# Patient Record
Sex: Female | Born: 1990 | Race: Black or African American | Hispanic: No | Marital: Single | Smoking: Former smoker
Health system: Southern US, Community
[De-identification: ages and names within clinical notes are randomized; demographics above are authoritative.]

## PROBLEM LIST (undated history)

## (undated) DIAGNOSIS — F32A Depression, unspecified: Secondary | ICD-10-CM

## (undated) DIAGNOSIS — R519 Headache, unspecified: Secondary | ICD-10-CM

## (undated) DIAGNOSIS — F329 Major depressive disorder, single episode, unspecified: Secondary | ICD-10-CM

## (undated) DIAGNOSIS — R0602 Shortness of breath: Secondary | ICD-10-CM

## (undated) DIAGNOSIS — R51 Headache: Secondary | ICD-10-CM

## (undated) DIAGNOSIS — F419 Anxiety disorder, unspecified: Secondary | ICD-10-CM

## (undated) DIAGNOSIS — M549 Dorsalgia, unspecified: Secondary | ICD-10-CM

## (undated) DIAGNOSIS — J45909 Unspecified asthma, uncomplicated: Secondary | ICD-10-CM

## (undated) DIAGNOSIS — R079 Chest pain, unspecified: Secondary | ICD-10-CM

## (undated) DIAGNOSIS — M2550 Pain in unspecified joint: Secondary | ICD-10-CM

## (undated) DIAGNOSIS — D649 Anemia, unspecified: Secondary | ICD-10-CM

## (undated) HISTORY — DX: Anxiety disorder, unspecified: F41.9

## (undated) HISTORY — DX: Major depressive disorder, single episode, unspecified: F32.9

## (undated) HISTORY — DX: Headache, unspecified: R51.9

## (undated) HISTORY — DX: Headache: R51

## (undated) HISTORY — DX: Chest pain, unspecified: R07.9

## (undated) HISTORY — DX: Anemia, unspecified: D64.9

## (undated) HISTORY — PX: WISDOM TOOTH EXTRACTION: SHX21

## (undated) HISTORY — DX: Shortness of breath: R06.02

## (undated) HISTORY — DX: Pain in unspecified joint: M25.50

## (undated) HISTORY — DX: Depression, unspecified: F32.A

## (undated) HISTORY — DX: Dorsalgia, unspecified: M54.9

## (undated) HISTORY — PX: CORNEAL TRANSPLANT: SHX108

## (undated) HISTORY — DX: Unspecified asthma, uncomplicated: J45.909

---

## 2013-05-05 DIAGNOSIS — H18609 Keratoconus, unspecified, unspecified eye: Secondary | ICD-10-CM | POA: Insufficient documentation

## 2014-04-28 ENCOUNTER — Emergency Department (HOSPITAL_COMMUNITY): Payer: Self-pay

## 2014-04-28 ENCOUNTER — Emergency Department (HOSPITAL_COMMUNITY)
Admission: EM | Admit: 2014-04-28 | Discharge: 2014-04-28 | Disposition: A | Discharge status: Home / Self Care | Payer: Self-pay | Attending: Emergency Medicine | Admitting: Emergency Medicine

## 2014-04-28 ENCOUNTER — Encounter (HOSPITAL_COMMUNITY): Payer: Self-pay | Admitting: Physical Medicine and Rehabilitation

## 2014-04-28 DIAGNOSIS — Z7983 Long term (current) use of bisphosphonates: Secondary | ICD-10-CM | POA: Insufficient documentation

## 2014-04-28 DIAGNOSIS — Z3202 Encounter for pregnancy test, result negative: Secondary | ICD-10-CM | POA: Insufficient documentation

## 2014-04-28 DIAGNOSIS — N920 Excessive and frequent menstruation with regular cycle: Secondary | ICD-10-CM | POA: Insufficient documentation

## 2014-04-28 DIAGNOSIS — R102 Pelvic and perineal pain: Secondary | ICD-10-CM | POA: Insufficient documentation

## 2014-04-28 LAB — CBC WITH DIFFERENTIAL/PLATELET
Basophils Absolute: 0 10*3/uL (ref 0.0–0.1)
Basophils Relative: 0 % (ref 0–1)
Eosinophils Absolute: 0.1 10*3/uL (ref 0.0–0.7)
Eosinophils Relative: 1 % (ref 0–5)
HCT: 31.8 % — ABNORMAL LOW (ref 36.0–46.0)
Hemoglobin: 10.2 g/dL — ABNORMAL LOW (ref 12.0–15.0)
Lymphocytes Relative: 38 % (ref 12–46)
Lymphs Abs: 1.8 10*3/uL (ref 0.7–4.0)
MCH: 27.1 pg (ref 26.0–34.0)
MCHC: 32.1 g/dL (ref 30.0–36.0)
MCV: 84.6 fL (ref 78.0–100.0)
Monocytes Absolute: 0.5 10*3/uL (ref 0.1–1.0)
Monocytes Relative: 11 % (ref 3–12)
Neutro Abs: 2.4 10*3/uL (ref 1.7–7.7)
Neutrophils Relative %: 50 % (ref 43–77)
Platelets: 390 10*3/uL (ref 150–400)
RBC: 3.76 MIL/uL — ABNORMAL LOW (ref 3.87–5.11)
RDW: 13.1 % (ref 11.5–15.5)
WBC: 4.9 10*3/uL (ref 4.0–10.5)

## 2014-04-28 LAB — WET PREP, GENITAL
Trich, Wet Prep: NONE SEEN
WBC, Wet Prep HPF POC: NONE SEEN
Yeast Wet Prep HPF POC: NONE SEEN

## 2014-04-28 LAB — URINALYSIS, ROUTINE W REFLEX MICROSCOPIC
Bilirubin Urine: NEGATIVE
Glucose, UA: NEGATIVE mg/dL
Ketones, ur: NEGATIVE mg/dL
Nitrite: NEGATIVE
Protein, ur: 30 mg/dL — AB
Specific Gravity, Urine: 1.017 (ref 1.005–1.030)
Urobilinogen, UA: 0.2 mg/dL (ref 0.0–1.0)
pH: 5.5 (ref 5.0–8.0)

## 2014-04-28 LAB — URINE MICROSCOPIC-ADD ON

## 2014-04-28 LAB — COMPREHENSIVE METABOLIC PANEL
ALT: 16 U/L (ref 0–35)
AST: 18 U/L (ref 0–37)
Albumin: 3.5 g/dL (ref 3.5–5.2)
Alkaline Phosphatase: 83 U/L (ref 39–117)
Anion gap: 10 (ref 5–15)
BUN: 9 mg/dL (ref 6–23)
CO2: 25 mEq/L (ref 19–32)
Calcium: 8.8 mg/dL (ref 8.4–10.5)
Chloride: 105 mEq/L (ref 96–112)
Creatinine, Ser: 0.58 mg/dL (ref 0.50–1.10)
GFR calc Af Amer: >90 mL/min (ref >90)
GFR calc non Af Amer: >90 mL/min (ref >90)
Glucose, Bld: 91 mg/dL (ref 70–99)
Potassium: 3.9 mEq/L (ref 3.7–5.3)
Sodium: 140 mEq/L (ref 137–147)
Total Bilirubin: 0.3 mg/dL (ref 0.3–1.2)
Total Protein: 7.1 g/dL (ref 6.0–8.3)

## 2014-04-28 LAB — POC URINE PREG, ED: Preg Test, Ur: NEGATIVE

## 2014-04-28 LAB — RPR

## 2014-04-28 LAB — HIV ANTIBODY (ROUTINE TESTING W REFLEX): HIV 1&2 Ab, 4th Generation: NONREACTIVE

## 2014-04-28 MED ORDER — HYDROCODONE-ACETAMINOPHEN 5-325 MG PO TABS: 1.0000 | ORAL_TABLET | ORAL | Status: DC | PRN

## 2014-04-28 MED ORDER — MEDROXYPROGESTERONE ACETATE 5 MG PO TABS: 10.0000 mg | ORAL_TABLET | Freq: Every day | ORAL | Status: DC

## 2014-04-28 MED ORDER — HYDROCODONE-ACETAMINOPHEN 5-325 MG PO TABS
1.0000 | ORAL_TABLET | Freq: Once | ORAL | Status: AC
  Administered 2014-04-28: 1 via ORAL
  Filled 2014-04-28: qty 1

## 2014-04-28 NOTE — ED Notes (Signed)
Pt just returned from [place]

## 2014-04-28 NOTE — ED Provider Notes (Signed)
CSN: [id number]     Arrival date & time 04/28/14  1301 History   First MD Initiated Contact with Patient 04/28/14 1531     Chief Complaint  Patient presents with  . Abdominal Pain     (Consider location/radiation/quality/duration/timing/severity/associated sxs/prior Treatment) HPI  Traci Finley is a 23 y.o. female without significant PMH presenting with 2 days of severe bilateral menstrual cramps as well as passing blood clots from her vagina at the onset of menses. Patient states her normal menstrual periods at times can have blood clots but these ones are larger times and on and other times a golfball. Patient states she saw her OB/GYN yesterday and had blood work drawn and was found to be mildly anemic. No other medications were prescribed at that time. Patient denies fevers, chills, illnesses, nausea, vomiting, constipation. Last BM today and normal without blood. Patient states her menses are every month and regular. She takes no oral contraceptive pills or IUD. She states she is not sexually active. No change in vaginal discharge or foul odor. No urinary symptoms.   History reviewed. No pertinent past medical history. History reviewed. No pertinent past surgical history. No family history on file. History  Substance Use Topics  . Smoking status: Never Smoker   . Smokeless tobacco: Not on file  . Alcohol Use: No   OB History    No data available     Review of Systems  Constitutional: Negative for fever and chills.  HENT: Negative for congestion and rhinorrhea.   Respiratory: Negative for shortness of breath.   Gastrointestinal: Negative for nausea, vomiting and diarrhea.  Genitourinary: Positive for vaginal bleeding and pelvic pain. Negative for dysuria and hematuria.  Musculoskeletal: Negative for back pain and gait problem.  Skin: Negative for rash.  Neurological: Negative for weakness and headaches.      Allergies  Review of patient's allergies indicates no known  allergies.  Home Medications   Prior to Admission medications   Medication Sig Start Date End Date Taking? Authorizing Provider  HYDROcodone-acetaminophen (NORCO/VICODIN) 5-325 MG per tablet Take 1 tablet by mouth every 4 (four) hours as needed for moderate pain or severe pain. 04/28/14   Louann Sjogren, PA-C  ibuprofen (ADVIL,MOTRIN) 200 MG tablet Take 200 mg by mouth every 6 (six) hours as needed for mild pain.   Yes Historical Provider, MD  medroxyPROGESTERone (PROVERA) 5 MG tablet Take 2 tablets (10 mg total) by mouth daily. 04/28/14   Benetta Spar L Aronda Burford, PA-C   BP 125/73 mmHg  Pulse 62  Temp(Src) 98.2 F (36.8 C) (Oral)  Resp 15  Ht 5\' 4"  (1.626 m)  Wt 215 lb (97.523 kg)  BMI 36.89 kg/m2  SpO2 100% Physical Exam  Constitutional: She appears well-developed and well-nourished. No distress.  HENT:  Head: Normocephalic and atraumatic.  Eyes: Conjunctivae and EOM are normal. Right eye exhibits no discharge. Left eye exhibits no discharge.  Cardiovascular: Normal rate, regular rhythm and normal heart sounds.   Pulmonary/Chest: Effort normal and breath sounds normal. No respiratory distress. She has no wheezes.  Abdominal: Soft. Bowel sounds are normal. She exhibits no distension. There is no tenderness.  Mild right, mid, left suprapubic tenderness without rebound, guarding, rigidity. No CVA tenderness or back tenderness.  Genitourinary:  Cervix pink without lesions. Os closed. No CMT mild left and right adnexal tenderness. Minimal discharge without foul odor. Blood from os, that does not rapidly reaccumulate. Nursing tech in room for exam.   Neurological: She is alert. She exhibits  normal muscle tone. Coordination normal.  Skin: Skin is warm and dry. She is not diaphoretic.  Nursing note and vitals reviewed.   ED Course  Procedures (including critical care time) Labs Review Labs Reviewed  WET PREP, GENITAL - Abnormal; Notable for the following:    Clue Cells Wet Prep HPF POC  FEW (*)    All other components within normal limits  CBC WITH DIFFERENTIAL - Abnormal; Notable for the following:    RBC 3.76 (*)    Hemoglobin 10.2 (*)    HCT 31.8 (*)    All other components within normal limits  URINALYSIS, ROUTINE W REFLEX MICROSCOPIC - Abnormal; Notable for the following:    Color, Urine RED (*)    APPearance HAZY (*)    Hgb urine dipstick LARGE (*)    Protein, ur 30 (*)    Leukocytes, UA SMALL (*)    All other components within normal limits  GC/CHLAMYDIA PROBE AMP  COMPREHENSIVE METABOLIC PANEL  RPR  URINE MICROSCOPIC-ADD ON  HIV ANTIBODY (ROUTINE TESTING)  POC URINE PREG, ED    Imaging Review [place] Pelvis Complete  04/28/2014   CLINICAL DATA:  Pelvic pain in female.  EXAM: TRANSABDOMINAL ULTRASOUND OF PELVIS  DOPPLER ULTRASOUND OF OVARIES  TECHNIQUE: Transabdominal ultrasound examination of the pelvis was performed including evaluation of the uterus, ovaries, adnexal regions, and pelvic cul-de-sac.  Color and duplex Doppler ultrasound was utilized to evaluate blood flow to the ovaries.  COMPARISON:  None.  FINDINGS: Uterus  Measurements: 6.3 x 5.5 x 4.1 cm. No fibroids or other mass visualized.  Endometrium  Thickness: 6.1 mm. No focal abnormality visualized.  Right ovary  Measurements: 2.1 x 1.7 x 1.6 cm. Normal appearance/no adnexal mass.  Left ovary  Measurements: 2.6 x 2.6 x 1.4 cm. Normal appearance/no adnexal mass.  Pulsed Doppler evaluation demonstrates normal low-resistance arterial and venous waveforms in both ovaries.  IMPRESSION: No abnormality seen in the pelvis.   Electronically Signed   By: Roque LiasJames  Green M.D.   On: 04/28/2014 19:43   [place] Art/ven Flow Abd Pelv Doppler  04/28/2014   CLINICAL DATA:  Pelvic pain in female.  EXAM: TRANSABDOMINAL ULTRASOUND OF PELVIS  DOPPLER ULTRASOUND OF OVARIES  TECHNIQUE: Transabdominal ultrasound examination of the pelvis was performed including evaluation of the uterus, ovaries, adnexal regions, and pelvic cul-de-sac.   Color and duplex Doppler ultrasound was utilized to evaluate blood flow to the ovaries.  COMPARISON:  None.  FINDINGS: Uterus  Measurements: 6.3 x 5.5 x 4.1 cm. No fibroids or other mass visualized.  Endometrium  Thickness: 6.1 mm. No focal abnormality visualized.  Right ovary  Measurements: 2.1 x 1.7 x 1.6 cm. Normal appearance/no adnexal mass.  Left ovary  Measurements: 2.6 x 2.6 x 1.4 cm. Normal appearance/no adnexal mass.  Pulsed Doppler evaluation demonstrates normal low-resistance arterial and venous waveforms in both ovaries.  IMPRESSION: No abnormality seen in the pelvis.   Electronically Signed   By: Roque LiasJames  Green M.D.   On: 04/28/2014 19:43     EKG Interpretation None      MDM   Final diagnoses:  Pelvic pain in female  Menorrhagia with regular cycle   Patient with 2 days of severe menstrual cramping as well as passing large blood clots. VSS. Patient with right mid and left suprapubic abdominal tenderness without rebound or signs of peritonitis. Patient without chest pain, lightheadedness or dizziness. Labs reassuring. No signs of infection in urine. Pelvic exam with normal discomfort. No white blood cells seen.  Blood did not reaccumulate rapidly. GC and Chlamydia as well as syphilis and HIV testing pending. I doubt PID. Patient pelvic ultrasound without signs of poor vision, adnexal masses or fibroids. Source of patient's abdominal pain unclear and likely related to her heavy bleeding and menses. Patient given short course of pain medicines. Driving and sedation precautions provided. Patient also given Provera for her heavy bleeding as well as instructions to follow-up with her OB/GYN in 2-3 days. Patient is afebrile, nontoxic, and in no acute distress. Patient is appropriate for outpatient management and is stable for discharge.  Discussed return precautions with patient. Discussed all results and patient verbalizes understanding and agrees with plan.  Case has been discussed with Dr.  Blinda LeatherwoodPollina who agrees with the above plan and to discharge.      Louann SjogrenVictoria L Jeffory Snelgrove, PA-C 04/28/14 2059  Gilda Creasehristopher J. Pollina, MD 04/29/14 1536

## 2014-04-28 NOTE — ED Notes (Signed)
Patient transported to Ultrasound 

## 2014-04-28 NOTE — Discharge Instructions (Signed)
Return to the emergency room with worsening of symptoms, new symptoms or with symptoms that are concerning, especially fevers, chest pain, lightheadedness, nausea, unable to tolerate fluids, severe abdominal pain, vomiting blood or blood in stool. Ibuprofen 400mg  (2 tablets 200mg ) every 5-6 hours for 3-5 days. Provera 10mg  daily for 7 days. Norco for severe pain. Do not operate machinery, drive or drink alcohol while taking narcotics or muscle relaxers. Please call your OBGYN doctor for a followup appointment within 24-48 hours. When you talk to your doctor please let them know that you were seen in the emergency department and have them acquire all of your records so that they can discuss the findings with you and formulate a treatment plan to fully care for your new and ongoing problems.    Abdominal Pain, Women Abdominal (stomach, pelvic, or belly) pain can be caused by many things. It is important to tell your doctor:  The location of the pain.  Does it come and go or is it present all the time?  Are there things that start the pain (eating certain foods, exercise)?  Are there other symptoms associated with the pain (fever, nausea, vomiting, diarrhea)? All of this is helpful to know when trying to find the cause of the pain. CAUSES   Stomach: virus or bacteria infection, or ulcer.  Intestine: appendicitis (inflamed appendix), regional ileitis (Crohn's disease), ulcerative colitis (inflamed colon), irritable bowel syndrome, diverticulitis (inflamed diverticulum of the colon), or cancer of the stomach or intestine.  Gallbladder disease or stones in the gallbladder.  Kidney disease, kidney stones, or infection.  Pancreas infection or cancer.  Fibromyalgia (pain disorder).  Diseases of the female organs:  Uterus: fibroid (non-cancerous) tumors or infection.  Fallopian tubes: infection or tubal pregnancy.  Ovary: cysts or tumors.  Pelvic adhesions (scar  tissue).  Endometriosis (uterus lining tissue growing in the pelvis and on the pelvic organs).  Pelvic congestion syndrome (female organs filling up with blood just before the menstrual period).  Pain with the menstrual period.  Pain with ovulation (producing an egg).  Pain with an IUD (intrauterine device, birth control) in the uterus.  Cancer of the female organs.  Functional pain (pain not caused by a disease, may improve without treatment).  Psychological pain.  Depression. DIAGNOSIS  Your doctor will decide the seriousness of your pain by doing an examination.  Blood tests.  X-rays.  Ultrasound.  CT scan (computed tomography, special type of X-ray).  MRI (magnetic resonance imaging).  Cultures, for infection.  Barium enema (dye inserted in the large intestine, to better view it with X-rays).  Colonoscopy (looking in intestine with a lighted tube).  Laparoscopy (minor surgery, looking in abdomen with a lighted tube).  Major abdominal exploratory surgery (looking in abdomen with a large incision). TREATMENT  The treatment will depend on the cause of the pain.   Many cases can be observed and treated at home.  Over-the-counter medicines recommended by your caregiver.  Prescription medicine.  Antibiotics, for infection.  Birth control pills, for painful periods or for ovulation pain.  Hormone treatment, for endometriosis.  Nerve blocking injections.  Physical therapy.  Antidepressants.  Counseling with a psychologist or psychiatrist.  Minor or major surgery. HOME CARE INSTRUCTIONS   Do not take laxatives, unless directed by your caregiver.  Take over-the-counter pain medicine only if ordered by your caregiver. Do not take aspirin because it can cause an upset stomach or bleeding.  Try a clear liquid diet (broth or water) as ordered by  your caregiver. Slowly move to a bland diet, as tolerated, if the pain is related to the stomach or  intestine.  Have a thermometer and take your temperature several times a day, and record it.  Bed rest and sleep, if it helps the pain.  Avoid sexual intercourse, if it causes pain.  Avoid stressful situations.  Keep your follow-up appointments and tests, as your caregiver orders.  If the pain does not go away with medicine or surgery, you may try:  Acupuncture.  Relaxation exercises (yoga, meditation).  Group therapy.  Counseling. SEEK MEDICAL CARE IF:   You notice certain foods cause stomach pain.  Your home care treatment is not helping your pain.  You need stronger pain medicine.  You want your IUD removed.  You feel faint or lightheaded.  You develop nausea and vomiting.  You develop a rash.  You are having side effects or an allergy to your medicine. SEEK IMMEDIATE MEDICAL CARE IF:   Your pain does not go away or gets worse.  You have a fever.  Your pain is felt only in portions of the abdomen. The right side could possibly be appendicitis. The left lower portion of the abdomen could be colitis or diverticulitis.  You are passing blood in your stools (bright red or black tarry stools, with or without vomiting).  You have blood in your urine.  You develop chills, with or without a fever.  You pass out. MAKE SURE YOU:   Understand these instructions.  Will watch your condition.  Will get help right away if you are not doing well or get worse. Document Released: 02/26/2007 Document Revised: 09/15/2013 Document Reviewed: 03/18/2009 [facility] Patient Information 2015 [place], [place]. This information is not intended to replace advice given to you by your health care provider. Make sure you discuss any questions you have with your health care provider.   Abnormal Uterine Bleeding Abnormal uterine bleeding means bleeding from the vagina that is not your normal menstrual period. This can be:  Bleeding or spotting between periods.  Bleeding after sex  (sexual intercourse).  Bleeding that is heavier or more than normal.  Periods that last longer than usual.  Bleeding after menopause. There are many problems that may cause this. Treatment will depend on the cause of the bleeding. Any kind of bleeding that is not normal should be reviewed by your doctor.  HOME CARE Watch your condition for any changes. These actions may lessen any discomfort you are having:  Do not use tampons or douches as told by your doctor.  Change your pads often. You should get regular pelvic exams and Pap tests. Keep all appointments for tests as told by your doctor. GET HELP IF:  You are bleeding for more than 1 week.  You feel dizzy at times. GET HELP RIGHT AWAY IF:   You pass out.  You have to change pads every 15 to 30 minutes.  You have belly pain.  You have a fever.  You become sweaty or weak.  You are passing large blood clots from the vagina.  You feel sick to your stomach (nauseous) and throw up (vomit). MAKE SURE YOU:  Understand these instructions.  Will watch your condition.  Will get help right away if you are not doing well or get worse. Document Released: 02/26/2009 Document Revised: 05/06/2013 Document Reviewed: 11/28/2012 [facility]Exit[facility] Patient Information 2015 [place], [place]. This information is not intended to replace advice given to you by your health care provider. Make sure you  discuss any questions you have with your health care provider. ° °

## 2014-04-28 NOTE — ED Notes (Signed)
Pt states severe menstrual cramps today. Onset 3am, states she passed several large blood clots. Pt states 8/10 pain upon arrival, [facility]. No signs of distress noted at present.

## 2014-04-29 LAB — GC/CHLAMYDIA PROBE AMP
CT Probe RNA: NEGATIVE
GC Probe RNA: NEGATIVE

## 2014-06-10 ENCOUNTER — Ambulatory Visit: Payer: Self-pay | Admitting: Internal Medicine

## 2014-06-13 ENCOUNTER — Encounter (HOSPITAL_COMMUNITY): Payer: Self-pay | Admitting: Emergency Medicine

## 2014-06-13 ENCOUNTER — Emergency Department (HOSPITAL_COMMUNITY): Payer: No Typology Code available for payment source

## 2014-06-13 ENCOUNTER — Emergency Department (HOSPITAL_COMMUNITY)
Admission: EM | Admit: 2014-06-13 | Discharge: 2014-06-14 | Disposition: A | Discharge status: Home / Self Care | Payer: No Typology Code available for payment source | Attending: Emergency Medicine | Admitting: Emergency Medicine

## 2014-06-13 DIAGNOSIS — Y998 Other external cause status: Secondary | ICD-10-CM | POA: Insufficient documentation

## 2014-06-13 DIAGNOSIS — Y92410 Unspecified street and highway as the place of occurrence of the external cause: Secondary | ICD-10-CM | POA: Insufficient documentation

## 2014-06-13 DIAGNOSIS — S8001XA Contusion of right knee, initial encounter: Secondary | ICD-10-CM

## 2014-06-13 DIAGNOSIS — S29012A Strain of muscle and tendon of back wall of thorax, initial encounter: Secondary | ICD-10-CM | POA: Insufficient documentation

## 2014-06-13 DIAGNOSIS — Y9389 Activity, other specified: Secondary | ICD-10-CM | POA: Insufficient documentation

## 2014-06-13 DIAGNOSIS — S24109A Unspecified injury at unspecified level of thoracic spinal cord, initial encounter: Secondary | ICD-10-CM | POA: Diagnosis present

## 2014-06-13 DIAGNOSIS — Z793 Long term (current) use of hormonal contraceptives: Secondary | ICD-10-CM | POA: Insufficient documentation

## 2014-06-13 DIAGNOSIS — T148XXA Other injury of unspecified body region, initial encounter: Secondary | ICD-10-CM
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
## 2014-06-14 MED ORDER — CYCLOBENZAPRINE HCL 10 MG PO TABS: 10.0000 mg | ORAL_TABLET | Freq: Two times a day (BID) | ORAL | Status: DC | PRN

## 2014-06-14 MED ORDER — IBUPROFEN 800 MG PO TABS: 800.0000 mg | ORAL_TABLET | Freq: Three times a day (TID) | ORAL | Status: DC

## 2014-06-14 NOTE — ED Notes (Signed)
Awake. Verbally responsive. A/O x4. Resp even and unlabored. No audible adventitious breath sounds noted. ABC's intact.  
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
## 2014-06-14 NOTE — ED Notes (Signed)
Awake. Verbally responsive. A/O x4. Resp even and unlabored. No audible adventitious breath sounds noted. ABC's intact. NAD noted. Family at bedside.

## 2014-10-27 ENCOUNTER — Encounter: Payer: Self-pay | Admitting: Internal Medicine

## 2014-10-27 ENCOUNTER — Ambulatory Visit (INDEPENDENT_AMBULATORY_CARE_PROVIDER_SITE_OTHER): Payer: BLUE CROSS/BLUE SHIELD | Admitting: Internal Medicine

## 2014-10-27 ENCOUNTER — Other Ambulatory Visit (INDEPENDENT_AMBULATORY_CARE_PROVIDER_SITE_OTHER): Payer: BLUE CROSS/BLUE SHIELD

## 2014-10-27 VITALS — BP 110/74 | HR 68 | Temp 98.7°F | Resp 12 | Ht 64.0 in | Wt 215.0 lb

## 2014-10-27 DIAGNOSIS — E669 Obesity, unspecified: Secondary | ICD-10-CM

## 2014-10-27 DIAGNOSIS — Z0000 Encounter for general adult medical examination without abnormal findings: Secondary | ICD-10-CM

## 2014-10-27 LAB — COMPREHENSIVE METABOLIC PANEL
ALT: 12 U/L (ref 0–35)
AST: 13 U/L (ref 0–37)
Albumin: 3.9 g/dL (ref 3.5–5.2)
Alkaline Phosphatase: 75 U/L (ref 39–117)
BUN: 13 mg/dL (ref 6–23)
CO2: 28 mEq/L (ref 19–32)
Calcium: 8.9 mg/dL (ref 8.4–10.5)
Chloride: 103 mEq/L (ref 96–112)
Creatinine, Ser: 0.69 mg/dL (ref 0.40–1.20)
GFR: 132.63 mL/min (ref >60.00)
Glucose, Bld: 86 mg/dL (ref 70–99)
Potassium: 3.9 mEq/L (ref 3.5–5.1)
Sodium: 136 mEq/L (ref 135–145)
Total Bilirubin: 0.3 mg/dL (ref 0.2–1.2)
Total Protein: 7.3 g/dL (ref 6.0–8.3)

## 2014-10-27 LAB — LIPID PANEL
Cholesterol: 124 mg/dL (ref 0–200)
HDL: 31.8 mg/dL — ABNORMAL LOW (ref >39.00)
LDL Cholesterol: 76 mg/dL (ref 0–99)
NonHDL: 92.2
Total CHOL/HDL Ratio: 4
Triglycerides: 80 mg/dL (ref 0.0–149.0)
VLDL: 16 mg/dL (ref 0.0–40.0)

## 2014-10-27 LAB — CBC
HCT: 32.9 % — ABNORMAL LOW (ref 36.0–46.0)
Hemoglobin: 10.7 g/dL — ABNORMAL LOW (ref 12.0–15.0)
MCHC: 32.6 g/dL (ref 30.0–36.0)
MCV: 85.4 fl (ref 78.0–100.0)
Platelets: 474 10*3/uL — ABNORMAL HIGH (ref 150.0–400.0)
RBC: 3.85 Mil/uL — ABNORMAL LOW (ref 3.87–5.11)
RDW: 14.9 % (ref 11.5–15.5)
WBC: 5.6 10*3/uL (ref 4.0–10.5)

## 2014-10-27 LAB — HEMOGLOBIN A1C: Hgb A1c MFr Bld: 5.1 % (ref 4.6–6.5)

## 2014-10-27 MED ORDER — CYCLOBENZAPRINE HCL 10 MG PO TABS: 5.0000 mg | ORAL_TABLET | Freq: Three times a day (TID) | ORAL | Status: DC | PRN

## 2014-10-27 NOTE — Progress Notes (Signed)
   Subjective:    Patient ID: Traci Finley, female    DOB: [birth year], 24 y.o.   MRN: [id number]  HPI The patient is a 24 YO new female coming in to talk about her weight. She does struggle with her weight for most of her life. She wants to be healthy and not have a shortened life from weight. There is strong history of diabetes in the family. She is going to go back to the gym and plans on starting cross fit in the next few months. She has questions about nutrition and would like to talk to a nutrition specialist. Right now she is working on eating better and decreasing the pre-prepared foods in her diet.   PMH, [facility], social history reviewed and updated.  Review of Systems  Constitutional: Negative for fever, activity change, appetite change and fatigue.  HENT: Negative.   Eyes: Negative.   Respiratory: Negative for cough, chest tightness, shortness of breath and wheezing.   Cardiovascular: Negative for chest pain, palpitations and leg swelling.  Gastrointestinal: Negative for abdominal pain, diarrhea, constipation and abdominal distention.  Musculoskeletal: Negative.   Skin: Negative.   Neurological: Negative for dizziness, weakness, light-headedness and numbness.  Psychiatric/Behavioral: Negative.       Objective:   Physical Exam  Constitutional: She is oriented to person, place, and time. She appears well-developed and well-nourished.  Overweight  HENT:  Head: Normocephalic and atraumatic.  Eyes: EOM are normal.  Neck: Normal range of motion.  Cardiovascular: Normal rate and regular rhythm.   Pulmonary/Chest: Effort normal and breath sounds normal. No respiratory distress. She has no wheezes. She has no rales.  Abdominal: Soft. Bowel sounds are normal. She exhibits no distension. There is no tenderness. There is no rebound.  Musculoskeletal: She exhibits no edema.  Neurological: She is alert and oriented to person, place, and time. Coordination normal.  Skin: Skin is warm and  dry.  Psychiatric: She has a normal mood and affect.   Filed Vitals:   10/27/14 1620  BP: 110/74  Pulse: 68  Temp: 98.7 F (37.1 C)  TempSrc: Oral  Resp: 12  Height: 5\' 4"  (1.626 m)  Weight: 215 lb (97.523 kg)      Assessment & Plan:

## 2014-10-27 NOTE — Progress Notes (Signed)
Pre visit review using our clinic review tool, if applicable. No additional management support is needed unless otherwise documented below in the visit note. 

## 2014-10-27 NOTE — Patient Instructions (Signed)
We will check on the blood work today and call you back with the results.   We will also send you to the nutritionist to jump start the healthy eating.   Come back in about 6-12 months so we can check on you. If you have problems or questions please feel free to call the office.   Health Maintenance Adopting a healthy lifestyle and getting preventive care can go a long way to promote health and wellness. Talk with your health care provider about what schedule of regular examinations is right for you. This is a good chance for you to check in with your provider about disease prevention and staying healthy. In between checkups, there are plenty of things you can do on your own. Experts have done a lot of research about which lifestyle changes and preventive measures are most likely to keep you healthy. Ask your health care provider for more information. WEIGHT AND DIET  Eat a healthy diet  Be sure to include plenty of vegetables, fruits, low-fat dairy products, and lean protein.  Do not eat a lot of foods high in solid fats, added sugars, or salt.  Get regular exercise. This is one of the most important things you can do for your health.  Most adults should exercise for at least 150 minutes each week. The exercise should increase your heart rate and make you sweat (moderate-intensity exercise).  Most adults should also do strengthening exercises at least twice a week. This is in addition to the moderate-intensity exercise.  Maintain a healthy weight  Body mass index (BMI) is a measurement that can be used to identify possible weight problems. It estimates body fat based on height and weight. Your health care provider can help determine your BMI and help you achieve or maintain a healthy weight.  For females 20 years of age and older:   A BMI below 18.5 is considered underweight.  A BMI of 18.5 to 24.9 is normal.  A BMI of 25 to 29.9 is considered overweight.  A BMI of 30 and above is  considered obese.  Watch levels of cholesterol and blood lipids  You should start having your blood tested for lipids and cholesterol at 26 years of age, then have this test every 5 years.  You may need to have your cholesterol levels checked more often if:  Your lipid or cholesterol levels are high.  You are older than 26 years of age.  You are at high risk for heart disease.  CANCER SCREENING   Lung Cancer  Lung cancer screening is recommended for adults 55-80 years old who are at high risk for lung cancer because of a history of smoking.  A yearly low-dose CT scan of the lungs is recommended for people who:  Currently smoke.  Have quit within the past 15 years.  Have at least a 30-pack-year history of smoking. A pack year is smoking an average of one pack of cigarettes a day for 1 year.  Yearly screening should continue until it has been 15 years since you quit.  Yearly screening should stop if you develop a health problem that would prevent you from having lung cancer treatment.  Breast Cancer  Practice breast self-awareness. This means understanding how your breasts normally appear and feel.  It also means doing regular breast self-exams. Let your health care provider know about any changes, no matter how small.  If you are in your 20s or 30s, you should have a clinical breast exam (  CBE) by a health care provider every 1-3 years as part of a regular health exam.  If you are 40 or older, have a CBE every year. Also consider having a breast X-ray (mammogram) every year.  If you have a family history of breast cancer, talk to your health care provider about genetic screening.  If you are at high risk for breast cancer, talk to your health care provider about having an MRI and a mammogram every year.  Breast cancer gene (BRCA) assessment is recommended for women who have family members with BRCA-related cancers. BRCA-related cancers  include:  Breast.  Ovarian.  Tubal.  Peritoneal cancers.  Results of the assessment will determine the need for genetic counseling and BRCA1 and BRCA2 testing. Cervical Cancer Routine pelvic examinations to screen for cervical cancer are no longer recommended for nonpregnant women who are considered low risk for cancer of the pelvic organs (ovaries, uterus, and vagina) and who do not have symptoms. A pelvic examination may be necessary if you have symptoms including those associated with pelvic infections. Ask your health care provider if a screening pelvic exam is right for you.   The Pap test is the screening test for cervical cancer for women who are considered at risk.  If you had a hysterectomy for a problem that was not cancer or a condition that could lead to cancer, then you no longer need Pap tests.  If you are older than 65 years, and you have had normal Pap tests for the past 10 years, you no longer need to have Pap tests.  If you have had past treatment for cervical cancer or a condition that could lead to cancer, you need Pap tests and screening for cancer for at least 20 years after your treatment.  If you no longer get a Pap test, assess your risk factors if they change (such as having a new sexual partner). This can affect whether you should start being screened again.  Some women have medical problems that increase their chance of getting cervical cancer. If this is the case for you, your health care provider may recommend more frequent screening and Pap tests.  The human papillomavirus (HPV) test is another test that may be used for cervical cancer screening. The HPV test looks for the virus that can cause cell changes in the cervix. The cells collected during the Pap test can be tested for HPV.  The HPV test can be used to screen women 30 years of age and older. Getting tested for HPV can extend the interval between normal Pap tests from three to five years.  An HPV  test also should be used to screen women of any age who have unclear Pap test results.  After 26 years of age, women should have HPV testing as often as Pap tests.  Colorectal Cancer  This type of cancer can be detected and often prevented.  Routine colorectal cancer screening usually begins at 26 years of age and continues through 26 years of age.  Your health care provider may recommend screening at an earlier age if you have risk factors for colon cancer.  Your health care provider may also recommend using home test kits to check for hidden blood in the stool.  A small camera at the end of a tube can be used to examine your colon directly (sigmoidoscopy or colonoscopy). This is done to check for the earliest forms of colorectal cancer.  Routine screening usually begins at age 50.    Direct examination of the colon should be repeated every 5-10 years through 26 years of age. However, you may need to be screened more often if early forms of precancerous polyps or small growths are found. Skin Cancer  Check your skin from head to toe regularly.  Tell your health care provider about any new moles or changes in moles, especially if there is a change in a mole's shape or color.  Also tell your health care provider if you have a mole that is larger than the size of a pencil eraser.  Always use sunscreen. Apply sunscreen liberally and repeatedly throughout the day.  Protect yourself by wearing long sleeves, pants, a wide-brimmed hat, and sunglasses whenever you are outside. HEART DISEASE, DIABETES, AND HIGH BLOOD PRESSURE   Have your blood pressure checked at least every 1-2 years. High blood pressure causes heart disease and increases the risk of stroke.  If you are between 83 years and 38 years old, ask your health care provider if you should take aspirin to prevent strokes.  Have regular diabetes screenings. This involves taking a blood sample to check your fasting blood sugar  level.  If you are at a normal weight and have a low risk for diabetes, have this test once every three years after 26 years of age.  If you are overweight and have a high risk for diabetes, consider being tested at a younger age or more often. PREVENTING INFECTION  Hepatitis B  If you have a higher risk for hepatitis B, you should be screened for this virus. You are considered at high risk for hepatitis B if:  You were born in a country where hepatitis B is common. Ask your health care provider which countries are considered high risk.  Your parents were born in a high-risk country, and you have not been immunized against hepatitis B (hepatitis B vaccine).  You have HIV or AIDS.  You use needles to inject street drugs.  You live with someone who has hepatitis B.  You have had sex with someone who has hepatitis B.  You get hemodialysis treatment.  You take certain medicines for conditions, including cancer, organ transplantation, and autoimmune conditions. Hepatitis C  Blood testing is recommended for:  Everyone born from [birth year] through 1967.  Anyone with known risk factors for hepatitis C. Sexually transmitted infections (STIs)  You should be screened for sexually transmitted infections (STIs) including gonorrhea and chlamydia if:  You are sexually active and are younger than 26 years of age.  You are older than 27 years of age and your health care provider tells you that you are at risk for this type of infection.  Your sexual activity has changed since you were last screened and you are at an increased risk for chlamydia or gonorrhea. Ask your health care provider if you are at risk.  If you do not have HIV, but are at risk, it may be recommended that you take a prescription medicine daily to prevent HIV infection. This is called pre-exposure prophylaxis (PrEP). You are considered at risk if:  You are sexually active and do not regularly use condoms or know the HIV status  of your partner(s).  You take drugs by injection.  You are sexually active with a partner who has HIV. Talk with your health care provider about whether you are at high risk of being infected with HIV. If you choose to begin PrEP, you should first be tested for HIV. You should then be  tested every 3 months for as long as you are taking PrEP.  PREGNANCY   If you are premenopausal and you may become pregnant, ask your health care provider about preconception counseling.  If you may become pregnant, take 400 to 800 micrograms (mcg) of folic acid every day.  If you want to prevent pregnancy, talk to your health care provider about birth control (contraception). OSTEOPOROSIS AND MENOPAUSE   Osteoporosis is a disease in which the bones lose minerals and strength with aging. This can result in serious bone fractures. Your risk for osteoporosis can be identified using a bone density scan.  If you are 75 years of age or older, or if you are at risk for osteoporosis and fractures, ask your health care provider if you should be screened.  Ask your health care provider whether you should take a calcium or vitamin D supplement to lower your risk for osteoporosis.  Menopause may have certain physical symptoms and risks.  Hormone replacement therapy may reduce some of these symptoms and risks. Talk to your health care provider about whether hormone replacement therapy is right for you.  HOME CARE INSTRUCTIONS   Schedule regular health, dental, and eye exams.  Stay current with your immunizations.   Do not use any tobacco products including cigarettes, chewing tobacco, or electronic cigarettes.  If you are pregnant, do not drink alcohol.  If you are breastfeeding, limit how much and how often you drink alcohol.  Limit alcohol intake to no more than 1 drink per day for nonpregnant women. One drink equals 12 ounces of beer, 5 ounces of wine, or 1 ounces of hard liquor.  Do not use street  drugs.  Do not share needles.  Ask your health care provider for help if you need support or information about quitting drugs.  Tell your health care provider if you often feel depressed.  Tell your health care provider if you have ever been abused or do not feel safe at home. Document Released: 11/14/2010 Document Revised: 09/15/2013 Document Reviewed: 04/02/2013 [facility] Patient Information 2015 [place], [place]. This information is not intended to replace advice given to you by your health care provider. Make sure you discuss any questions you have with your health care provider.

## 2014-10-29 DIAGNOSIS — E669 Obesity, unspecified: Secondary | ICD-10-CM | POA: Insufficient documentation

## 2014-10-29 NOTE — Assessment & Plan Note (Signed)
BP okay, checking labs for any complications. Referral to medical nutrition and she is going to start an exercise program. She is highly motivated for success. Talked with her about various gym supplements and recommended that eating healthy diet and appropriate calories for exercise regimen is generally sufficient and would not recommend protein supplements, creatine, herbal supplements.

## 2014-12-11 ENCOUNTER — Ambulatory Visit: Payer: BLUE CROSS/BLUE SHIELD | Admitting: Skilled Nursing Facility1

## 2015-10-12 DIAGNOSIS — M25572 Pain in left ankle and joints of left foot: Secondary | ICD-10-CM | POA: Diagnosis not present

## 2015-11-04 ENCOUNTER — Encounter: Payer: Self-pay | Admitting: Internal Medicine

## 2015-11-04 ENCOUNTER — Ambulatory Visit (INDEPENDENT_AMBULATORY_CARE_PROVIDER_SITE_OTHER): Payer: BLUE CROSS/BLUE SHIELD | Admitting: Internal Medicine

## 2015-11-04 ENCOUNTER — Other Ambulatory Visit (INDEPENDENT_AMBULATORY_CARE_PROVIDER_SITE_OTHER): Payer: BLUE CROSS/BLUE SHIELD

## 2015-11-04 VITALS — BP 112/78 | HR 96 | Temp 98.4°F | Resp 14 | Ht 64.0 in | Wt 224.1 lb

## 2015-11-04 DIAGNOSIS — M7662 Achilles tendinitis, left leg: Secondary | ICD-10-CM

## 2015-11-04 DIAGNOSIS — Z0000 Encounter for general adult medical examination without abnormal findings: Secondary | ICD-10-CM

## 2015-11-04 DIAGNOSIS — E669 Obesity, unspecified: Secondary | ICD-10-CM | POA: Diagnosis not present

## 2015-11-04 LAB — CBC
HCT: 30.8 % — ABNORMAL LOW (ref 36.0–46.0)
Hemoglobin: 10.1 g/dL — ABNORMAL LOW (ref 12.0–15.0)
MCHC: 32.6 g/dL (ref 30.0–36.0)
MCV: 81.1 fl (ref 78.0–100.0)
Platelets: 459 10*3/uL — ABNORMAL HIGH (ref 150.0–400.0)
RBC: 3.8 Mil/uL — ABNORMAL LOW (ref 3.87–5.11)
RDW: 15.1 % (ref 11.5–15.5)
WBC: 4.9 10*3/uL (ref 4.0–10.5)

## 2015-11-04 LAB — T4, FREE: Free T4: 0.97 ng/dL (ref 0.60–1.60)

## 2015-11-04 LAB — TSH: TSH: 2.88 u[IU]/mL (ref 0.35–4.50)

## 2015-11-04 NOTE — Assessment & Plan Note (Signed)
Checking some labs, exercising some and working on her weight. Referral to ortho for evaluation of her tendons. Talked to her about the risk of distracted driving. Given screening recommendations.

## 2015-11-04 NOTE — Patient Instructions (Signed)
We will get you in with the orthopedic doctor to help with the tendon to check it.   We are checking the blood work for the blood counts and the thyroid.   Achilles Tendinitis With Rehab Achilles tendinitis is a disorder of the Achilles tendon. The Achilles tendon connects the large calf muscles (Gastrocnemius and Soleus) to the heel bone (calcaneus). This tendon is sometimes called the heel cord. It is important for pushing-off and standing on your toes and is important for walking, running, or jumping. Tendinitis is often caused by overuse and repetitive microtrauma. SYMPTOMS  Pain, tenderness, swelling, warmth, and redness may occur over the Achilles tendon even at rest.  Pain with pushing off, or flexing or extending the ankle.  Pain that is worsened after or during activity. CAUSES   Overuse sometimes seen with rapid increase in exercise programs or in sports requiring running and jumping.  Poor physical conditioning (strength and flexibility or endurance).  Running sports, especially training running down hills.  Inadequate warm-up before practice or play or failure to stretch before participation.  Injury to the tendon. PREVENTION   Warm up and stretch before practice or competition.  Allow time for adequate rest and recovery between practices and competition.  Keep up conditioning.  Keep up ankle and leg flexibility.  Improve or keep muscle strength and endurance.  Improve cardiovascular fitness.  Use proper technique.  Use proper equipment (shoes, skates).  To help prevent recurrence, taping, protective strapping, or an adhesive bandage may be recommended for several weeks after healing is complete. PROGNOSIS   Recovery may take weeks to several months to heal.  Longer recovery is expected if symptoms have been prolonged.  Recovery is usually quicker if the inflammation is due to a direct blow as compared with overuse or sudden strain. RELATED COMPLICATIONS     Healing time will be prolonged if the condition is not correctly treated. The injury must be given plenty of time to heal.  Symptoms can reoccur if activity is resumed too soon.  Untreated, tendinitis may increase the risk of tendon rupture requiring additional time for recovery and possibly surgery. TREATMENT   The first treatment consists of rest anti-inflammatory medication, and ice to relieve the pain.  Stretching and strengthening exercises after resolution of pain will likely help reduce the risk of recurrence. Referral to a physical therapist or athletic trainer for further evaluation and treatment may be helpful.  A walking boot or cast may be recommended to rest the Achilles tendon. This can help break the cycle of inflammation and microtrauma.  Arch supports (orthotics) may be prescribed or recommended by your caregiver as an adjunct to therapy and rest.  Surgery to remove the inflamed tendon lining or degenerated tendon tissue is rarely necessary and has shown less than predictable results. MEDICATION   Nonsteroidal anti-inflammatory medications, such as aspirin and ibuprofen, may be used for pain and inflammation relief. Do not take within 7 days before surgery. Take these as directed by your caregiver. Contact your caregiver immediately if any bleeding, stomach upset, or signs of allergic reaction occur. Other minor pain relievers, such as acetaminophen, may also be used.  Pain relievers may be prescribed as necessary by your caregiver. Do not take prescription pain medication for longer than 4 to 7 days. Use only as directed and only as much as you need.  Cortisone injections are rarely indicated. Cortisone injections may weaken tendons and predispose to rupture. It is better to give the condition more time  to heal than to use them. HEAT AND COLD  Cold is used to relieve pain and reduce inflammation for acute and chronic Achilles tendinitis. Cold should be applied for 10 to  15 minutes every 2 to 3 hours for inflammation and pain and immediately after any activity that aggravates your symptoms. Use ice packs or an ice massage.  Heat may be used before performing stretching and strengthening activities prescribed by your caregiver. Use a heat pack or a warm soak. SEEK MEDICAL CARE IF:  Symptoms get worse or do not improve in 2 weeks despite treatment.  New, unexplained symptoms develop. Drugs used in treatment may produce side effects. EXERCISES RANGE OF MOTION (ROM) AND STRETCHING EXERCISES - Achilles Tendinitis  These exercises may help you when beginning to rehabilitate your injury. Your symptoms may resolve with or without further involvement from your physician, physical therapist or athletic trainer. While completing these exercises, remember:   Restoring tissue flexibility helps normal motion to return to the joints. This allows healthier, less painful movement and activity.  An effective stretch should be held for at least 30 seconds.  A stretch should never be painful. You should only feel a gentle lengthening or release in the stretched tissue. STRETCH - Gastroc, Standing   Place hands on wall.  Extend right / left leg, keeping the front knee somewhat bent.  Slightly point your toes inward on your back foot.  Keeping your right / left heel on the floor and your knee straight, shift your weight toward the wall, not allowing your back to arch.  You should feel a gentle stretch in the right / left calf. Hold this position for __________ seconds. Repeat __________ times. Complete this stretch __________ times per day. STRETCH - Soleus, Standing   Place hands on wall.  Extend right / left leg, keeping the other knee somewhat bent.  Slightly point your toes inward on your back foot.  Keep your right / left heel on the floor, bend your back knee, and slightly shift your weight over the back leg so that you feel a gentle stretch deep in your back  calf.  Hold this position for __________ seconds. Repeat __________ times. Complete this stretch __________ times per day. STRETCH - Gastrocsoleus, Standing  Note: This exercise can place a lot of stress on your foot and ankle. Please complete this exercise only if specifically instructed by your caregiver.   Place the ball of your right / left foot on a step, keeping your other foot firmly on the same step.  Hold on to the wall or a rail for balance.  Slowly lift your other foot, allowing your body weight to press your heel down over the edge of the step.  You should feel a stretch in your right / left calf.  Hold this position for __________ seconds.  Repeat this exercise with a slight bend in your knee. Repeat __________ times. Complete this stretch __________ times per day.  STRENGTHENING EXERCISES - Achilles Tendinitis These exercises may help you when beginning to rehabilitate your injury. They may resolve your symptoms with or without further involvement from your physician, physical therapist or athletic trainer. While completing these exercises, remember:   Muscles can gain both the endurance and the strength needed for everyday activities through controlled exercises.  Complete these exercises as instructed by your physician, physical therapist or athletic trainer. Progress the resistance and repetitions only as guided.  You may experience muscle soreness or fatigue, but the pain or  discomfort you are trying to eliminate should never worsen during these exercises. If this pain does worsen, stop and make certain you are following the directions exactly. If the pain is still present after adjustments, discontinue the exercise until you can discuss the trouble with your clinician. STRENGTH - Plantar-flexors   Sit with your right / left leg extended. Holding onto both ends of a rubber exercise band/tubing, loop it around the ball of your foot. Keep a slight tension in the  band.  Slowly push your toes away from you, pointing them downward.  Hold this position for __________ seconds. Return slowly, controlling the tension in the band/tubing. Repeat __________ times. Complete this exercise __________ times per day.  STRENGTH - Plantar-flexors   Stand with your feet shoulder width apart. Steady yourself with a wall or table using as little support as needed.  Keeping your weight evenly spread over the width of your feet, rise up on your toes.*  Hold this position for __________ seconds. Repeat __________ times. Complete this exercise __________ times per day.  *If this is too easy, shift your weight toward your right / left leg until you feel challenged. Ultimately, you may be asked to do this exercise with your right / left foot only. STRENGTH - Plantar-flexors, Eccentric  Note: This exercise can place a lot of stress on your foot and ankle. Please complete this exercise only if specifically instructed by your caregiver.   Place the balls of your feet on a step. With your hands, use only enough support from a wall or rail to keep your balance.  Keep your knees straight and rise up on your toes.  Slowly shift your weight entirely to your right / left toes and pick up your opposite foot. Gently and with controlled movement, lower your weight through your right / left foot so that your heel drops below the level of the step. You will feel a slight stretch in the back of your calf at the end position.  Use the healthy leg to help rise up onto the balls of both feet, then lower weight only on the right / left leg again. Build up to 15 repetitions. Then progress to 3 consecutive sets of 15 repetitions.*  After completing the above exercise, complete the same exercise with a slight knee bend (about 30 degrees). Again, build up to 15 repetitions. Then progress to 3 consecutive sets of 15 repetitions.* Perform this exercise __________ times per day.  *When you easily  complete 3 sets of 15, your physician, physical therapist or athletic trainer may advise you to add resistance by wearing a backpack filled with additional weight. STRENGTH - Plantar Flexors, Seated   Sit on a chair that allows your feet to rest flat on the ground. If necessary, sit at the edge of the chair.  Keeping your toes firmly on the ground, lift your right / left heel as far as you can without increasing any discomfort in your ankle. Repeat __________ times. Complete this exercise __________ times a day. *If instructed by your physician, physical therapist or athletic trainer, you may add ____________________ of resistance by placing a weighted object on your right / left knee.   This information is not intended to replace advice given to you by your health care provider. Make sure you discuss any questions you have with your health care provider.   Document Released: 11/30/2004 Document Revised: 05/22/2014 Document Reviewed: 08/13/2008 Elsevier Interactive Patient Education 2016 [place] Maintenance, Female  Adopting a healthy lifestyle and getting preventive care can go a long way to promote health and wellness. Talk with your health care provider about what schedule of regular examinations is right for you. This is a good chance for you to check in with your provider about disease prevention and staying healthy. In between checkups, there are plenty of things you can do on your own. Experts have done a lot of research about which lifestyle changes and preventive measures are most likely to keep you healthy. Ask your health care provider for more information. WEIGHT AND DIET  Eat a healthy diet  Be sure to include plenty of vegetables, fruits, low-fat dairy products, and lean protein.  Do not eat a lot of foods high in solid fats, added sugars, or salt.  Get regular exercise. This is one of the most important things you can do for your health.  Most adults should  exercise for at least 150 minutes each week. The exercise should increase your heart rate and make you sweat (moderate-intensity exercise).  Most adults should also do strengthening exercises at least twice a week. This is in addition to the moderate-intensity exercise.  Maintain a healthy weight  Body mass index (BMI) is a measurement that can be used to identify possible weight problems. It estimates body fat based on height and weight. Your health care provider can help determine your BMI and help you achieve or maintain a healthy weight.  For females 74 years of age and older:   A BMI below 18.5 is considered underweight.  A BMI of 18.5 to 24.9 is normal.  A BMI of 25 to 29.9 is considered overweight.  A BMI of 30 and above is considered obese.  Watch levels of cholesterol and blood lipids  You should start having your blood tested for lipids and cholesterol at 27 years of age, then have this test every 5 years.  You may need to have your cholesterol levels checked more often if:  Your lipid or cholesterol levels are high.  You are older than 27 years of age.  You are at high risk for heart disease.  CANCER SCREENING   Lung Cancer  Lung cancer screening is recommended for adults 27-2 years old who are at high risk for lung cancer because of a history of smoking.  A yearly low-dose CT scan of the lungs is recommended for people who:  Currently smoke.  Have quit within the past 15 years.  Have at least a 30-pack-year history of smoking. A pack year is smoking an average of one pack of cigarettes a day for 1 year.  Yearly screening should continue until it has been 15 years since you quit.  Yearly screening should stop if you develop a health problem that would prevent you from having lung cancer treatment.  Breast Cancer  Practice breast self-awareness. This means understanding how your breasts normally appear and feel.  It also means doing regular breast  self-exams. Let your health care provider know about any changes, no matter how small.  If you are in your 20s or 30s, you should have a clinical breast exam (CBE) by a health care provider every 1-3 years as part of a regular health exam.  If you are 68 or older, have a CBE every year. Also consider having a breast X-ray (mammogram) every year.  If you have a family history of breast cancer, talk to your health care provider about genetic screening.  If you are at  high risk for breast cancer, talk to your health care provider about having an MRI and a mammogram every year.  Breast cancer gene (BRCA) assessment is recommended for women who have family members with BRCA-related cancers. BRCA-related cancers include:  Breast.  Ovarian.  Tubal.  Peritoneal cancers.  Results of the assessment will determine the need for genetic counseling and BRCA1 and BRCA2 testing. Cervical Cancer Your health care provider may recommend that you be screened regularly for cancer of the pelvic organs (ovaries, uterus, and vagina). This screening involves a pelvic examination, including checking for microscopic changes to the surface of your cervix (Pap test). You may be encouraged to have this screening done every 3 years, beginning at age 62.  For women ages 7-65, health care providers may recommend pelvic exams and Pap testing every 3 years, or they may recommend the Pap and pelvic exam, combined with testing for human papilloma virus (HPV), every 5 years. Some types of HPV increase your risk of cervical cancer. Testing for HPV may also be done on women of any age with unclear Pap test results.  Other health care providers may not recommend any screening for nonpregnant women who are considered low risk for pelvic cancer and who do not have symptoms. Ask your health care provider if a screening pelvic exam is right for you.  If you have had past treatment for cervical cancer or a condition that could lead  to cancer, you need Pap tests and screening for cancer for at least 20 years after your treatment. If Pap tests have been discontinued, your risk factors (such as having a new sexual partner) need to be reassessed to determine if screening should resume. Some women have medical problems that increase the chance of getting cervical cancer. In these cases, your health care provider may recommend more frequent screening and Pap tests. Colorectal Cancer  This type of cancer can be detected and often prevented.  Routine colorectal cancer screening usually begins at 27 years of age and continues through 27 years of age.  Your health care provider may recommend screening at an earlier age if you have risk factors for colon cancer.  Your health care provider may also recommend using home test kits to check for hidden blood in the stool.  A small camera at the end of a tube can be used to examine your colon directly (sigmoidoscopy or colonoscopy). This is done to check for the earliest forms of colorectal cancer.  Routine screening usually begins at age 66.  Direct examination of the colon should be repeated every 5-10 years through 27 years of age. However, you may need to be screened more often if early forms of precancerous polyps or small growths are found. Skin Cancer  Check your skin from head to toe regularly.  Tell your health care provider about any new moles or changes in moles, especially if there is a change in a mole's shape or color.  Also tell your health care provider if you have a mole that is larger than the size of a pencil eraser.  Always use sunscreen. Apply sunscreen liberally and repeatedly throughout the day.  Protect yourself by wearing long sleeves, pants, a wide-brimmed hat, and sunglasses whenever you are outside. HEART DISEASE, DIABETES, AND HIGH BLOOD PRESSURE   High blood pressure causes heart disease and increases the risk of stroke. High blood pressure is more  likely to develop in:  People who have blood pressure in the high end of the  normal range (130-139/85-89 mm Hg).  People who are overweight or obese.  People who are African American.  If you are 19-43 years of age, have your blood pressure checked every 3-5 years. If you are 57 years of age or older, have your blood pressure checked every year. You should have your blood pressure measured twice--once when you are at a hospital or clinic, and once when you are not at a hospital or clinic. Record the average of the two measurements. To check your blood pressure when you are not at a hospital or clinic, you can use:  An automated blood pressure machine at a pharmacy.  A home blood pressure monitor.  If you are between 31 years and 95 years old, ask your health care provider if you should take aspirin to prevent strokes.  Have regular diabetes screenings. This involves taking a blood sample to check your fasting blood sugar level.  If you are at a normal weight and have a low risk for diabetes, have this test once every three years after 27 years of age.  If you are overweight and have a high risk for diabetes, consider being tested at a younger age or more often. PREVENTING INFECTION  Hepatitis B  If you have a higher risk for hepatitis B, you should be screened for this virus. You are considered at high risk for hepatitis B if:  You were born in a country where hepatitis B is common. Ask your health care provider which countries are considered high risk.  Your parents were born in a high-risk country, and you have not been immunized against hepatitis B (hepatitis B vaccine).  You have HIV or AIDS.  You use needles to inject street drugs.  You live with someone who has hepatitis B.  You have had sex with someone who has hepatitis B.  You get hemodialysis treatment.  You take certain medicines for conditions, including cancer, organ transplantation, and autoimmune  conditions. Hepatitis C  Blood testing is recommended for:  Everyone born from [birth year] through 1967.  Anyone with known risk factors for hepatitis C. Sexually transmitted infections (STIs)  You should be screened for sexually transmitted infections (STIs) including gonorrhea and chlamydia if:  You are sexually active and are younger than 27 years of age.  You are older than 27 years of age and your health care provider tells you that you are at risk for this type of infection.  Your sexual activity has changed since you were last screened and you are at an increased risk for chlamydia or gonorrhea. Ask your health care provider if you are at risk.  If you do not have HIV, but are at risk, it may be recommended that you take a prescription medicine daily to prevent HIV infection. This is called pre-exposure prophylaxis (PrEP). You are considered at risk if:  You are sexually active and do not regularly use condoms or know the HIV status of your partner(s).  You take drugs by injection.  You are sexually active with a partner who has HIV. Talk with your health care provider about whether you are at high risk of being infected with HIV. If you choose to begin PrEP, you should first be tested for HIV. You should then be tested every 3 months for as long as you are taking PrEP.  PREGNANCY   If you are premenopausal and you may become pregnant, ask your health care provider about preconception counseling.  If you may become pregnant, take  400 to 800 micrograms (mcg) of folic acid every day.  If you want to prevent pregnancy, talk to your health care provider about birth control (contraception). OSTEOPOROSIS AND MENOPAUSE   Osteoporosis is a disease in which the bones lose minerals and strength with aging. This can result in serious bone fractures. Your risk for osteoporosis can be identified using a bone density scan.  If you are 22 years of age or older, or if you are at risk for  osteoporosis and fractures, ask your health care provider if you should be screened.  Ask your health care provider whether you should take a calcium or vitamin D supplement to lower your risk for osteoporosis.  Menopause may have certain physical symptoms and risks.  Hormone replacement therapy may reduce some of these symptoms and risks. Talk to your health care provider about whether hormone replacement therapy is right for you.  HOME CARE INSTRUCTIONS   Schedule regular health, dental, and eye exams.  Stay current with your immunizations.   Do not use any tobacco products including cigarettes, chewing tobacco, or electronic cigarettes.  If you are pregnant, do not drink alcohol.  If you are breastfeeding, limit how much and how often you drink alcohol.  Limit alcohol intake to no more than 1 drink per day for nonpregnant women. One drink equals 12 ounces of beer, 5 ounces of wine, or 1 ounces of hard liquor.  Do not use street drugs.  Do not share needles.  Ask your health care provider for help if you need support or information about quitting drugs.  Tell your health care provider if you often feel depressed.  Tell your health care provider if you have ever been abused or do not feel safe at home.   This information is not intended to replace advice given to you by your health care provider. Make sure you discuss any questions you have with your health care provider.   Document Released: 11/14/2010 Document Revised: 05/22/2014 Document Reviewed: 04/02/2013 Elsevier Interactive Patient Education [employer].

## 2015-11-04 NOTE — Progress Notes (Signed)
   Subjective:    Patient ID: Traci Finley, female    DOB: 07/25/1990, 25 y.o.   MRN: [id number]  HPI The patient is a 25 YO female coming in for wellness. Exercising more lately. Having some pain in the achilles tendon for the last several weeks.   [facility], social history reviewed and updated.   Review of Systems  Constitutional: Negative for fever, activity change, appetite change and fatigue.  HENT: Negative.   Eyes: Negative.   Respiratory: Negative for cough, chest tightness, shortness of breath and wheezing.   Cardiovascular: Negative for chest pain, palpitations and leg swelling.  Gastrointestinal: Negative for abdominal pain, diarrhea, constipation and abdominal distention.  Musculoskeletal: Positive for myalgias.  Skin: Negative.   Neurological: Negative for dizziness, weakness, light-headedness and numbness.  Psychiatric/Behavioral: Negative.       Objective:   Physical Exam  Constitutional: She is oriented to person, place, and time. She appears well-developed and well-nourished.  Overweight  HENT:  Head: Normocephalic and atraumatic.  Eyes: EOM are normal.  Neck: Normal range of motion.  Cardiovascular: Normal rate and regular rhythm.   Pulmonary/Chest: Effort normal and breath sounds normal. No respiratory distress. She has no wheezes. She has no rales.  Abdominal: Soft. Bowel sounds are normal. She exhibits no distension. There is no tenderness. There is no rebound.  Musculoskeletal: She exhibits no edema.  Neurological: She is alert and oriented to person, place, and time. Coordination normal.  Skin: Skin is warm and dry.  Psychiatric: She has a normal mood and affect.   Filed Vitals:   11/04/15 0830  BP: 112/78  Pulse: 96  Temp: 98.4 F (36.9 C)  TempSrc: Oral  Resp: 14  Height: 5\' 4"  (1.626 m)  Weight: 224 lb 1.9 oz (101.66 kg)  SpO2: 98%      Assessment & Plan:

## 2015-11-04 NOTE — Assessment & Plan Note (Signed)
Weight is up some and she is devoted to making some progress with this and working out more.

## 2015-11-09 ENCOUNTER — Telehealth: Payer: Self-pay | Admitting: Internal Medicine

## 2015-11-09 NOTE — Telephone Encounter (Signed)
Patient is calling to check up on referral to ortho surgery. Please follow up.

## 2015-11-10 NOTE — Telephone Encounter (Signed)
Scheduled pt with Delbert HarnessMurphy Wainer Left msg for pt to call back

## 2015-11-11 DIAGNOSIS — M7662 Achilles tendinitis, left leg: Secondary | ICD-10-CM | POA: Diagnosis not present

## 2015-11-18 DIAGNOSIS — M25572 Pain in left ankle and joints of left foot: Secondary | ICD-10-CM | POA: Diagnosis not present

## 2015-11-18 DIAGNOSIS — M25571 Pain in right ankle and joints of right foot: Secondary | ICD-10-CM | POA: Diagnosis not present

## 2015-11-18 DIAGNOSIS — R262 Difficulty in walking, not elsewhere classified: Secondary | ICD-10-CM | POA: Diagnosis not present

## 2015-11-18 DIAGNOSIS — M7662 Achilles tendinitis, left leg: Secondary | ICD-10-CM | POA: Diagnosis not present

## 2015-11-20 DIAGNOSIS — Z136 Encounter for screening for cardiovascular disorders: Secondary | ICD-10-CM | POA: Diagnosis not present

## 2015-11-20 DIAGNOSIS — Z131 Encounter for screening for diabetes mellitus: Secondary | ICD-10-CM | POA: Diagnosis not present

## 2015-11-20 DIAGNOSIS — Z713 Dietary counseling and surveillance: Secondary | ICD-10-CM | POA: Diagnosis not present

## 2015-11-20 DIAGNOSIS — Z13220 Encounter for screening for lipoid disorders: Secondary | ICD-10-CM | POA: Diagnosis not present

## 2016-07-03 ENCOUNTER — Ambulatory Visit (INDEPENDENT_AMBULATORY_CARE_PROVIDER_SITE_OTHER): Payer: BLUE CROSS/BLUE SHIELD | Admitting: Internal Medicine

## 2016-07-03 ENCOUNTER — Encounter: Payer: Self-pay | Admitting: Internal Medicine

## 2016-07-03 ENCOUNTER — Other Ambulatory Visit (INDEPENDENT_AMBULATORY_CARE_PROVIDER_SITE_OTHER): Payer: BLUE CROSS/BLUE SHIELD

## 2016-07-03 VITALS — BP 136/90 | HR 86 | Temp 98.4°F | Ht 64.0 in | Wt 229.0 lb

## 2016-07-03 DIAGNOSIS — R635 Abnormal weight gain: Secondary | ICD-10-CM

## 2016-07-03 DIAGNOSIS — E6609 Other obesity due to excess calories: Secondary | ICD-10-CM | POA: Diagnosis not present

## 2016-07-03 DIAGNOSIS — Z6839 Body mass index (BMI) 39.0-39.9, adult: Secondary | ICD-10-CM

## 2016-07-03 DIAGNOSIS — F419 Anxiety disorder, unspecified: Secondary | ICD-10-CM | POA: Diagnosis not present

## 2016-07-03 LAB — VITAMIN B12: Vitamin B-12: 481 pg/mL (ref 211–911)

## 2016-07-03 LAB — CBC
HCT: 29.3 % — ABNORMAL LOW (ref 36.0–46.0)
Hemoglobin: 9.5 g/dL — ABNORMAL LOW (ref 12.0–15.0)
MCHC: 32.4 g/dL (ref 30.0–36.0)
MCV: 80.9 fl (ref 78.0–100.0)
Platelets: 438 10*3/uL — ABNORMAL HIGH (ref 150.0–400.0)
RBC: 3.62 Mil/uL — ABNORMAL LOW (ref 3.87–5.11)
RDW: 17 % — ABNORMAL HIGH (ref 11.5–15.5)
WBC: 5.3 10*3/uL (ref 4.0–10.5)

## 2016-07-03 LAB — TSH: TSH: 2.62 u[IU]/mL (ref 0.35–4.50)

## 2016-07-03 LAB — HEMOGLOBIN A1C: Hgb A1c MFr Bld: 5.4 % (ref 4.6–6.5)

## 2016-07-03 LAB — T4, FREE: Free T4: 1.02 ng/dL (ref 0.60–1.60)

## 2016-07-03 NOTE — Patient Instructions (Signed)
We are checking the labs today and will call you back.   Think about reading the book called bright line eating by [employer] which talks to you about why our bodies do what they do with food.   Think about doing some counseling to try to work with the anxiety.   Work on regular exercise to help out with stress and with weight.    Stress and Stress Management Stress is a normal reaction to life events. It is what you feel when life demands more than you are used to or more than you can handle. Some stress can be useful. For example, the stress reaction can help you catch the last bus of the day, study for a test, or meet a deadline at work. But stress that occurs too often or for too long can cause problems. It can affect your emotional health and interfere with relationships and normal daily activities. Too much stress can weaken your immune system and increase your risk for physical illness. If you already have a medical problem, stress can make it worse. What are the causes? All sorts of life events may cause stress. An event that causes stress for one person may not be stressful for another person. Major life events commonly cause stress. These may be positive or negative. Examples include losing your job, moving into a new home, getting married, having a baby, or losing a loved one. Less obvious life events may also cause stress, especially if they occur day after day or in combination. Examples include working long hours, driving in traffic, caring for children, being in debt, or being in a difficult relationship. What are the signs or symptoms? Stress may cause emotional symptoms including, the following:  Anxiety. This is feeling worried, afraid, on edge, overwhelmed, or out of control.  Anger. This is feeling irritated or impatient.  Depression. This is feeling sad, down, helpless, or guilty.  Difficulty focusing, remembering, or making decisions. Stress may cause physical  symptoms, including the following:  Aches and pains. These may affect your head, neck, back, stomach, or other areas of your body.  Tight muscles or clenched jaw.  Low energy or trouble sleeping. Stress may cause unhealthy behaviors, including the following:  Eating to feel better (overeating) or skipping meals.  Sleeping too little, too much, or both.  Working too much or putting off tasks (procrastination).  Smoking, drinking alcohol, or using drugs to feel better. How is this diagnosed? Stress is diagnosed through an assessment by your health care provider. Your health care provider will ask questions about your symptoms and any stressful life events.Your health care provider will also ask about your medical history and may order blood tests or other tests. Certain medical conditions and medicine can cause physical symptoms similar to stress. Mental illness can cause emotional symptoms and unhealthy behaviors similar to stress. Your health care provider may refer you to a mental health professional for further evaluation. How is this treated? Stress management is the recommended treatment for stress.The goals of stress management are reducing stressful life events and coping with stress in healthy ways. Techniques for reducing stressful life events include the following:  Stress identification. Self-monitor for stress and identify what causes stress for you. These skills may help you to avoid some stressful events.  Time management. Set your priorities, keep a calendar of events, and learn to say "no." These tools can help you avoid making too many commitments. Techniques for coping with stress include the  following:  Rethinking the problem. Try to think realistically about stressful events rather than ignoring them or overreacting. Try to find the positives in a stressful situation rather than focusing on the negatives.  Exercise. Physical exercise can release both physical and  emotional tension. The key is to find a form of exercise you enjoy and do it regularly.  Relaxation techniques. These relax the body and mind. Examples include yoga, meditation, tai chi, biofeedback, deep breathing, progressive muscle relaxation, listening to music, being out in nature, journaling, and other hobbies. Again, the key is to find one or more that you enjoy and can do regularly.  Healthy lifestyle. Eat a balanced diet, get plenty of sleep, and do not smoke. Avoid using alcohol or drugs to relax.  Strong support network. Spend time with family, friends, or other people you enjoy being around.Express your feelings and talk things over with someone you trust. Counseling or talktherapy with a mental health professional may be helpful if you are having difficulty managing stress on your own. Medicine is typically not recommended for the treatment of stress.Talk to your health care provider if you think you need medicine for symptoms of stress. Follow these instructions at home:  Keep all follow-up visits as directed by your health care provider.  Take all medicines as directed by your health care provider. Contact a health care provider if:  Your symptoms get worse or you start having new symptoms.  You feel overwhelmed by your problems and can no longer manage them on your own. Get help right away if:  You feel like hurting yourself or someone else. This information is not intended to replace advice given to you by your health care provider. Make sure you discuss any questions you have with your health care provider. Document Released: 10/25/2000 Document Revised: 10/07/2015 Document Reviewed: 12/24/2012 Elsevier Interactive Patient Education  2017 [employer].

## 2016-07-03 NOTE — Progress Notes (Signed)
Pre visit review using our clinic review tool, if applicable. No additional management support is needed unless otherwise documented below in the visit note. 

## 2016-07-03 NOTE — Progress Notes (Signed)
   Subjective:    Patient ID: Traci Finley, female    DOB: 1991/01/02, 26 y.o.   MRN: [id number]  HPI The patient is a [age] YO female coming in for some anxiety and weight gain. She is having a lot more anxiety and stress at work and she is having some chest tightness along with SOB. This has happened twice. She is having more stress in life overall and this is bothering her sleeping. She denies crying spells, worthlessness, guilt. She is not drinking or smoking to compensate. She does use social media and thinks that this contributes to some of her stress. Work is also stressful. She does not see any counselor but would be open to this. She is not exercising.  The other concern is weight gain, she has been eating sugar more and is craving this a lot. She denies exercising lately. The holidays were hard for her and overate with stress. She is trying to make changes but feels frustrated as she just keeps gaining. No constipation, heat or cold intolerance.   Review of Systems  Constitutional: Positive for activity change, appetite change, fatigue and unexpected weight change. Negative for chills and fever.  HENT: Negative.   Eyes: Negative.   Respiratory: Positive for chest tightness. Negative for cough, shortness of breath and wheezing.   Cardiovascular: Negative.   Gastrointestinal: Negative.   Musculoskeletal: Negative.   Skin: Negative.   Neurological: Negative.   Psychiatric/Behavioral: Positive for decreased concentration, dysphoric mood and sleep disturbance. Negative for behavioral problems, confusion, self-injury and suicidal ideas. The patient is nervous/anxious.       Objective:   Physical Exam  Constitutional: She is oriented to person, place, and time. She appears well-developed and well-nourished.  Overweight  HENT:  Head: Normocephalic and atraumatic.  Eyes: EOM are normal.  Neck: Normal range of motion.  Cardiovascular: Normal rate and regular rhythm.   Pulmonary/Chest:  Effort normal and breath sounds normal. No respiratory distress. She has no wheezes. She has no rales.  Abdominal: Soft. She exhibits no distension. There is no tenderness. There is no rebound.  Musculoskeletal: She exhibits no edema.  Neurological: She is alert and oriented to person, place, and time.  Skin: Skin is warm and dry.  Psychiatric:  Some anxious during the visit.    Vitals:   07/03/16 1327  BP: 136/90  Pulse: 86  Temp: 98.4 F (36.9 C)  TempSrc: Oral  SpO2: 99%  Weight: 229 lb (103.9 kg)  Height: 5\' 4"  (1.626 m)      Assessment & Plan:

## 2016-07-05 DIAGNOSIS — F419 Anxiety disorder, unspecified: Secondary | ICD-10-CM | POA: Insufficient documentation

## 2016-07-05 NOTE — Assessment & Plan Note (Signed)
She is not severe at this time and feel she would get more benefit from CBT than medication. She does not need medication for anxiety at this time. She will seek counseling to help find other ways to deal with stress. She is given strategies for stress and talked to her extensively about how sugar can cause up and down moods due to the withdraw effect. She does not smoke or drink which is good. We talked about how those can negatively impact anxiety.

## 2016-07-05 NOTE — Assessment & Plan Note (Addendum)
Checking TSh, T4, B12, HgA1c, CBC, CMP for cause of the weight gain. We talked extensively about diet and exercise as part of a weight loss plan. 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

## 2016-09-18 DIAGNOSIS — H18603 Keratoconus, unspecified, bilateral: Secondary | ICD-10-CM | POA: Diagnosis not present

## 2016-12-02 ENCOUNTER — Emergency Department (HOSPITAL_COMMUNITY): Payer: BLUE CROSS/BLUE SHIELD

## 2016-12-02 ENCOUNTER — Emergency Department (HOSPITAL_COMMUNITY)
Admission: EM | Admit: 2016-12-02 | Discharge: 2016-12-02 | Disposition: A | Discharge status: Home / Self Care | Payer: BLUE CROSS/BLUE SHIELD | Attending: Emergency Medicine | Admitting: Emergency Medicine

## 2016-12-02 ENCOUNTER — Encounter (HOSPITAL_COMMUNITY): Payer: Self-pay | Admitting: Emergency Medicine

## 2016-12-02 DIAGNOSIS — J45909 Unspecified asthma, uncomplicated: Secondary | ICD-10-CM | POA: Diagnosis not present

## 2016-12-02 DIAGNOSIS — R1031 Right lower quadrant pain: Secondary | ICD-10-CM | POA: Insufficient documentation

## 2016-12-02 DIAGNOSIS — R109 Unspecified abdominal pain: Secondary | ICD-10-CM | POA: Diagnosis not present

## 2016-12-02 LAB — CBC
HCT: 31.6 % — ABNORMAL LOW (ref 36.0–46.0)
Hemoglobin: 9.8 g/dL — ABNORMAL LOW (ref 12.0–15.0)
MCH: 26.3 pg (ref 26.0–34.0)
MCHC: 31 g/dL (ref 30.0–36.0)
MCV: 84.7 fL (ref 78.0–100.0)
Platelets: 413 10*3/uL — ABNORMAL HIGH (ref 150–400)
RBC: 3.73 MIL/uL — ABNORMAL LOW (ref 3.87–5.11)
RDW: 13.7 % (ref 11.5–15.5)
WBC: 6.8 10*3/uL (ref 4.0–10.5)

## 2016-12-02 LAB — URINALYSIS, ROUTINE W REFLEX MICROSCOPIC
Bilirubin Urine: NEGATIVE
Glucose, UA: NEGATIVE mg/dL
Hgb urine dipstick: NEGATIVE
Ketones, ur: NEGATIVE mg/dL
Leukocytes, UA: NEGATIVE
Nitrite: NEGATIVE
Protein, ur: NEGATIVE mg/dL
Specific Gravity, Urine: 1.015 (ref 1.005–1.030)
pH: 6 (ref 5.0–8.0)

## 2016-12-02 LAB — COMPREHENSIVE METABOLIC PANEL
ALT: 12 U/L — ABNORMAL LOW (ref 14–54)
AST: 18 U/L (ref 15–41)
Albumin: 3.4 g/dL — ABNORMAL LOW (ref 3.5–5.0)
Alkaline Phosphatase: 80 U/L (ref 38–126)
Anion gap: 5 (ref 5–15)
BUN: 11 mg/dL (ref 6–20)
CO2: 26 mmol/L (ref 22–32)
Calcium: 8.4 mg/dL — ABNORMAL LOW (ref 8.9–10.3)
Chloride: 105 mmol/L (ref 101–111)
Creatinine, Ser: 0.72 mg/dL (ref 0.44–1.00)
GFR calc Af Amer: >60 mL/min (ref >60)
GFR calc non Af Amer: >60 mL/min (ref >60)
Glucose, Bld: 107 mg/dL — ABNORMAL HIGH (ref 65–99)
Potassium: 3.7 mmol/L (ref 3.5–5.1)
Sodium: 136 mmol/L (ref 135–145)
Total Bilirubin: 0.6 mg/dL (ref 0.3–1.2)
Total Protein: 6.5 g/dL (ref 6.5–8.1)

## 2016-12-02 LAB — POC URINE PREG, ED: Preg Test, Ur: NEGATIVE

## 2016-12-02 LAB — LIPASE, BLOOD: Lipase: 28 U/L (ref 11–51)

## 2016-12-02 MED ORDER — KETOROLAC TROMETHAMINE 60 MG/2ML IM SOLN
30.0000 mg | Freq: Once | INTRAMUSCULAR | Status: AC
  Administered 2016-12-02: 30 mg via INTRAMUSCULAR
  Filled 2016-12-02: qty 2

## 2016-12-02 MED ORDER — METHOCARBAMOL 500 MG PO TABS: 500.0000 mg | ORAL_TABLET | Freq: Two times a day (BID) | ORAL | 0 refills | Status: DC

## 2016-12-02 NOTE — ED Notes (Signed)
Patient transported to CT 

## 2016-12-02 NOTE — ED Triage Notes (Signed)
Pt woke up at 3am with sharp R sided abd pain and R flank pain.  Denies n/v/d, constipation, and urinary symptoms.

## 2016-12-02 NOTE — ED Provider Notes (Signed)
[facility]-EMERGENCY DEPT Provider Note   CSN: [id number] Arrival date & time: 12/02/16  0343     History   Chief Complaint Chief Complaint  Patient presents with  . Abdominal Pain  . Flank Pain    HPI Traci Finley is a 26 y.o. female.  HPI Traci Finley is a 26 y.o. female with hx of asthma and chronic headaches, presents to ED with complaint of flank pain and abdominal pain. Pt states she developed sharp stabbing pain in right flank around 3am this morning that woke her up from sleep. States pain radiated to the right lower abdomen. Pain was "the worst pain I have had." states pain has come and gone since then. Nothing making it better or worse. Currently only dull pain in the back. No fever, chills. No n/v/d. No urinary symptoms. No hx of same pain before. Pt has lifted and played with her niece yesterday but no other strenuous activity.   Past Medical History:  Diagnosis Date  . Asthma   . Persistent headaches     Patient Active Problem List   Diagnosis Date Noted  . Anxiety 07/05/2016  . Routine general medical examination at a health care facility 11/04/2015  . Obesity 10/29/2014    History reviewed. No pertinent surgical history.  OB History    No data available       Home Medications    Prior to Admission medications   Medication Sig Start Date End Date Taking? Authorizing Provider  Multiple Vitamins-Minerals (MULTIVITAMIN WITH MINERALS) tablet Take 1 tablet by mouth daily.    [provider]    Family History Family History  Problem Relation Age of Onset  . Arthritis Mother   . Hypertension Mother   . Cancer Mother        thyroid  . Diabetes Maternal Uncle   . Arthritis Maternal Grandmother   . Hypertension Maternal Grandmother   . Heart disease Maternal Grandmother   . Diabetes Maternal Grandmother   . Cancer Maternal Grandmother        thyroid    Social History Social History  Substance Use Topics  . Smoking status: Never Smoker    . Smokeless tobacco: Never Used  . Alcohol use No     Allergies   Patient has no known allergies.   Review of Systems Review of Systems  Constitutional: Negative for chills and fever.  Respiratory: Negative for cough, chest tightness and shortness of breath.   Cardiovascular: Negative for chest pain, palpitations and leg swelling.  Gastrointestinal: Positive for abdominal pain. Negative for diarrhea, nausea and vomiting.  Genitourinary: Positive for flank pain. Negative for difficulty urinating, dysuria, hematuria, pelvic pain, vaginal bleeding, vaginal discharge and vaginal pain.  Musculoskeletal: Positive for back pain. Negative for arthralgias, myalgias, neck pain and neck stiffness.  Skin: Negative for rash.  Neurological: Negative for dizziness, weakness and headaches.  All other systems reviewed and are negative.    Physical Exam Updated Vital Signs BP 127/80   Pulse 77   Temp 98.1 F (36.7 C) (Oral)   Resp 18   LMP 11/24/2016   SpO2 99%   Physical Exam  Constitutional: She is oriented to person, place, and time. She appears well-developed and well-nourished. No distress.  HENT:  Head: Normocephalic.  Eyes: Conjunctivae are normal.  Neck: Neck supple.  Cardiovascular: Normal rate, regular rhythm and normal heart sounds.   Pulmonary/Chest: Effort normal and breath sounds normal. No respiratory distress. She has no wheezes. She has no rales.  Abdominal: Soft. Bowel sounds are normal. She exhibits no distension. There is no tenderness. There is no rebound.  Right cva tenderness  Musculoskeletal: She exhibits no edema.  ttp over right perispinal muscles of the lumbar spine. No pain with bilateral straight leg raise  Neurological: She is alert and oriented to person, place, and time.  Skin: Skin is warm and dry.  Psychiatric: She has a normal mood and affect. Her behavior is normal.  Nursing note and vitals reviewed.    ED Treatments / Results  Labs (all labs  ordered are listed, but only abnormal results are displayed) Labs Reviewed  COMPREHENSIVE METABOLIC PANEL - Abnormal; Notable for the following:       Result Value   Glucose, Bld 107 (*)    Calcium 8.4 (*)    Albumin 3.4 (*)    ALT 12 (*)    All other components within normal limits  CBC - Abnormal; Notable for the following:    RBC 3.73 (*)    Hemoglobin 9.8 (*)    HCT 31.6 (*)    Platelets 413 (*)    All other components within normal limits  LIPASE, BLOOD  URINALYSIS, ROUTINE W REFLEX MICROSCOPIC  POC URINE PREG, ED    EKG  EKG Interpretation None       Radiology No results found.  Procedures Procedures (including critical care time)  Medications Ordered in ED Medications  ketorolac (TORADOL) injection 30 mg (30 mg Intramuscular Given 12/02/16 1610)     Initial Impression / Assessment and Plan / ED Course  I have reviewed the triage vital signs and the nursing notes.  Pertinent labs & imaging results that were available during my care of the patient were reviewed by me and considered in my medical decision making (see chart for details).     Pt with sudden onset of severe flank pain radiating down right abdomen. Currently pain improved but still there. Will get CT renal to ro kidney stone. Pt is anemic, otherwise normal labs.    8:08 AM CT negative. Pt still having slight pain but states it is much better. Question passed stone vs muscular pain/spasms. Pt is neurovascularly intact. She is worried about taking NSAIDs due to cornea surgery in few days. Advised to take tylenol for pain. Will give prescription for robaxin for muscle spasms. Follow up with pcp. Return precautions discussed.   Vitals:   12/02/16 0359 12/02/16 0704 12/02/16 0753  BP: 127/80 105/60 126/86  Pulse: 77 70 62  Resp: 18 14 16   Temp: 98.1 F (36.7 C)    TempSrc: Oral    SpO2: 99% 99% 99%     Final Clinical Impressions(s) / ED Diagnoses   Final diagnoses:  Right flank pain     New Prescriptions New Prescriptions   METHOCARBAMOL (ROBAXIN) 500 MG TABLET    Take 1 tablet (500 mg total) by mouth 2 (two) times daily.     Jaynie Crumble, PA-C 12/02/16 9604    Zadie Rhine, MD 12/03/16 0500

## 2016-12-02 NOTE — ED Notes (Signed)
Pt returned for CT

## 2016-12-02 NOTE — ED Notes (Signed)
Pt ambulatory to restroom

## 2016-12-02 NOTE — Discharge Instructions (Signed)
Take tylenol for pain. You can take 500mg  every 4 hrs. Take robaxin for muscle spasms as prescribed. Try heating pads. Stretches. Follow up with family doctor if not improving. Return if worsening.

## 2016-12-05 DIAGNOSIS — D649 Anemia, unspecified: Secondary | ICD-10-CM | POA: Diagnosis not present

## 2016-12-05 DIAGNOSIS — Z87891 Personal history of nicotine dependence: Secondary | ICD-10-CM | POA: Diagnosis not present

## 2016-12-05 DIAGNOSIS — J309 Allergic rhinitis, unspecified: Secondary | ICD-10-CM | POA: Diagnosis not present

## 2016-12-05 DIAGNOSIS — H18601 Keratoconus, unspecified, right eye: Secondary | ICD-10-CM | POA: Diagnosis not present

## 2017-01-08 ENCOUNTER — Telehealth: Payer: Self-pay | Admitting: Internal Medicine

## 2017-01-08 NOTE — Progress Notes (Signed)
Subjective:    Patient ID: Traci Finley, female    DOB: 1990/10/18, 26 y.o.   MRN: [id number]  HPI She is here for an acute visit.   Dizziness, headaches, nausea:  She has had daily headaches for a while.  The headaches are usually located in the right orbit and frontal region.  She has a headache most days when she wakes up and sometimes it fades away and sometimes it does not.  Yesterday she woke up dizzy and felt dizzy and off balance most of the morning.  It got better.  This is something new for her and does not usually come with the headaches.  She usually takes BC powder and that knocks it out.  Nothing else seems to work.  She takes BC powder 1-2 times a week.  Her mom has headaches, she is unsure if she has migraines.     Back pain:  It started 5 days ago.  It started as a mild discomfort and the pain intensity increased until it was severe.  The pain lasted a couple of hours.  The pain slowly faded.  She did take advil and is unsure if that helped or it just went away or not.  The pain is located in her lower back and radiated around her right side to her central abdomen.  She has associated nausea.  She had one other episode in 11/2016 and she went to the ED.  She denies any associated fever, chills, dysuria, hematuria, increased urinary frequency or changes in bowel habits.  Imaging for a possible kidney stone in July was negative.     Medications and allergies reviewed with patient and updated if appropriate.  Patient Active Problem List   Diagnosis Date Noted  . Anxiety 07/05/2016  . Routine general medical examination at a health care facility 11/04/2015  . Obesity 10/29/2014    Current Outpatient Prescriptions on File Prior to Visit  Medication Sig Dispense Refill  . methocarbamol (ROBAXIN) 500 MG tablet Take 1 tablet (500 mg total) by mouth 2 (two) times daily. 20 tablet 0   No current facility-administered medications on file prior to visit.     Past Medical  History:  Diagnosis Date  . Asthma   . Persistent headaches     No past surgical history on file.  Social History   Social History  . Marital status: Single    Spouse name: N/A  . Number of children: N/A  . Years of education: N/A   Social History Main Topics  . Smoking status: Never Smoker  . Smokeless tobacco: Never Used  . Alcohol use No  . Drug use: No  . Sexual activity: Not on file   Other Topics Concern  . Not on file   Social History Narrative  . No narrative on file    Family History  Problem Relation Age of Onset  . Arthritis Mother   . Hypertension Mother   . Cancer Mother        thyroid  . Diabetes Maternal Uncle   . Arthritis Maternal Grandmother   . Hypertension Maternal Grandmother   . Heart disease Maternal Grandmother   . Diabetes Maternal Grandmother   . Cancer Maternal Grandmother        thyroid    Review of Systems  Constitutional: Negative for chills and fever.  Eyes: Negative for visual disturbance.  Respiratory: Negative for apnea, cough, shortness of breath and wheezing.        No  snoring  Cardiovascular: Negative for chest pain and palpitations.  Gastrointestinal: Positive for abdominal pain and nausea. Negative for blood in stool, constipation and diarrhea.       No gerd  Genitourinary: Positive for flank pain. Negative for dysuria, frequency and hematuria.  Musculoskeletal: Positive for back pain.  Neurological: Positive for dizziness and headaches. Negative for weakness and numbness.       Objective:   Vitals:   01/09/17 0916  BP: 104/84  Pulse: 75  Resp: 16  Temp: 98.4 F (36.9 C)  SpO2: 98%   Filed Weights   01/09/17 0916  Weight: 227 lb (103 kg)   Body mass index is 38.96 kg/m.  Wt Readings from Last 3 Encounters:  01/09/17 227 lb (103 kg)  07/03/16 229 lb (103.9 kg)  11/04/15 224 lb 1.9 oz (101.7 kg)     Physical Exam  Constitutional: She is oriented to person, place, and time. She appears  well-developed and well-nourished. No distress.  HENT:  Head: Normocephalic and atraumatic.  Cardiovascular: Normal rate, regular rhythm and normal heart sounds.   Pulmonary/Chest: Effort normal and breath sounds normal. No respiratory distress. She has no wheezes. She has no rales.  Abdominal: Soft. She exhibits no distension and no mass. There is no tenderness. There is no rebound and no guarding.  Musculoskeletal: She exhibits no edema.  No lower back pain or cva tenderness with palpation  Neurological: She is alert and oriented to person, place, and time. No cranial nerve deficit.  Skin: Skin is warm and dry. She is not diaphoretic.  Psychiatric: She has a normal mood and affect. Her behavior is normal.          Assessment & Plan:   See Problem List for Assessment and Plan of chronic medical problems.

## 2017-01-08 NOTE — Telephone Encounter (Signed)
Patient Name: Traci Finley  DOB: 08/21/1990    Initial Comment Caller states was in the ER about a month ago with extreme back pain. Was sent home with some muscle relaxers. Says that she was fine until a few days ago. Started having the extreme back pain again. Took the muscle relaxer and ended up vomiting. Says that she has also been waking up with dizziness. Concerned.    Nurse Assessment  Nurse: Alvera Singh, RN, Bonita Quin Date/Time (Eastern Time): 01/08/2017 3:44:09 PM  Confirm and document reason for call. If symptomatic, describe symptoms. ---Caller states was in the ER about a month ago with extreme back pain. Was sent home with some muscle relaxers. Says that she was fine until a few days ago. Started having the extreme back pain again. She took the muscle relaxer for the first time and ended up vomiting. Says that she has also been waking up with dizziness. Caller is concerned about being so dizzy.  Does the patient have any new or worsening symptoms? ---Yes  Will a triage be completed? ---Yes  Related visit to physician within the last 2 weeks? ---No  Does the PT have any chronic conditions? (i.e. diabetes, asthma, etc.) ---No  Is the patient pregnant or possibly pregnant? (Ask all females between the ages of 61-55) ---No  Is this a behavioral health or substance abuse call? ---No     Guidelines    Guideline Title Affirmed Question Affirmed Notes  Dizziness - Lightheadedness Taking a medicine that could cause dizziness (e.g., blood pressure medications, diuretics)    Final Disposition User   See Physician within 24 Hours [place], RN, [employer] pt to make an appt. Appt made with Dr Cheryll Cockayne @ 0900, 01/09/17. Pt aware/agreeable.   Referrals  REFERRED TO PCP OFFICE  REFERRED TO PCP OFFICE   Disagree/Comply: Comply

## 2017-01-09 ENCOUNTER — Encounter: Payer: Self-pay | Admitting: Internal Medicine

## 2017-01-09 ENCOUNTER — Ambulatory Visit (INDEPENDENT_AMBULATORY_CARE_PROVIDER_SITE_OTHER): Payer: BLUE CROSS/BLUE SHIELD | Admitting: Internal Medicine

## 2017-01-09 VITALS — BP 104/84 | HR 75 | Temp 98.4°F | Resp 16 | Wt 227.0 lb

## 2017-01-09 DIAGNOSIS — M545 Low back pain, unspecified: Secondary | ICD-10-CM | POA: Insufficient documentation

## 2017-01-09 DIAGNOSIS — R51 Headache: Secondary | ICD-10-CM | POA: Diagnosis not present

## 2017-01-09 DIAGNOSIS — G8929 Other chronic pain: Secondary | ICD-10-CM

## 2017-01-09 NOTE — Assessment & Plan Note (Signed)
Almost daily headaches Taking advil or BC powder - advised not to take more than 2/week Interested in seeing neuro - will refer Deferred daily preventative medication Advised headache log

## 2017-01-09 NOTE — Patient Instructions (Addendum)
   Medications reviewed and updated.  No changes recommended at this time.  Avoid overusing advil and BC powders   A referral was ordered for neurology for evaluation of your headaches.

## 2017-01-09 NOTE — Assessment & Plan Note (Addendum)
Two episodes Ct negative for renal stones in July No urinary symptoms Probable musculoskeletal She will try taking advil or BC powder as soon as the pain starts next time to see if that helps, can try ice/heat Follow up if pain continues to recur

## 2017-01-10 ENCOUNTER — Encounter: Payer: Self-pay | Admitting: Neurology

## 2017-02-06 ENCOUNTER — Encounter (INDEPENDENT_AMBULATORY_CARE_PROVIDER_SITE_OTHER): Payer: Self-pay

## 2017-02-22 ENCOUNTER — Encounter (INDEPENDENT_AMBULATORY_CARE_PROVIDER_SITE_OTHER): Payer: Self-pay | Admitting: Family Medicine

## 2017-02-22 ENCOUNTER — Ambulatory Visit (INDEPENDENT_AMBULATORY_CARE_PROVIDER_SITE_OTHER): Payer: BLUE CROSS/BLUE SHIELD | Admitting: Family Medicine

## 2017-02-22 VITALS — BP 107/74 | HR 79 | Temp 98.2°F | Ht 65.0 in | Wt 226.0 lb

## 2017-02-22 DIAGNOSIS — R5383 Other fatigue: Secondary | ICD-10-CM | POA: Insufficient documentation

## 2017-02-22 DIAGNOSIS — Z1331 Encounter for screening for depression: Secondary | ICD-10-CM

## 2017-02-22 DIAGNOSIS — D649 Anemia, unspecified: Secondary | ICD-10-CM | POA: Insufficient documentation

## 2017-02-22 DIAGNOSIS — R0602 Shortness of breath: Secondary | ICD-10-CM | POA: Diagnosis not present

## 2017-02-22 DIAGNOSIS — Z0289 Encounter for other administrative examinations: Secondary | ICD-10-CM

## 2017-02-22 DIAGNOSIS — D508 Other iron deficiency anemias: Secondary | ICD-10-CM | POA: Diagnosis not present

## 2017-02-22 DIAGNOSIS — Z6837 Body mass index (BMI) 37.0-37.9, adult: Secondary | ICD-10-CM

## 2017-02-24 LAB — CBC WITH DIFFERENTIAL
Basophils Absolute: 0 10*3/uL (ref 0.0–0.2)
Basos: 0 %
EOS (ABSOLUTE): 0.1 10*3/uL (ref 0.0–0.4)
Eos: 2 %
Hemoglobin: 10.8 g/dL — ABNORMAL LOW (ref 11.1–15.9)
Immature Grans (Abs): 0 10*3/uL (ref 0.0–0.1)
Immature Granulocytes: 0 %
Lymphocytes Absolute: 1.6 10*3/uL (ref 0.7–3.1)
Lymphs: 39 %
MCH: 27.8 pg (ref 26.6–33.0)
MCHC: 33.1 g/dL (ref 31.5–35.7)
MCV: 84 fL (ref 79–97)
Monocytes Absolute: 0.4 10*3/uL (ref 0.1–0.9)
Monocytes: 9 %
Neutrophils Absolute: 2 10*3/uL (ref 1.4–7.0)
Neutrophils: 50 %
RBC: 3.88 x10E6/uL (ref 3.77–5.28)
RDW: 15.7 % — ABNORMAL HIGH (ref 12.3–15.4)
WBC: 4 10*3/uL (ref 3.4–10.8)

## 2017-02-24 LAB — LIPID PANEL WITH LDL/HDL RATIO
Cholesterol, Total: 127 mg/dL (ref 100–199)
HDL: 34 mg/dL — ABNORMAL LOW (ref >39)
LDL Calculated: 77 mg/dL (ref 0–99)
LDl/HDL Ratio: 2.3 ratio (ref 0.0–3.2)
Triglycerides: 80 mg/dL (ref 0–149)
VLDL Cholesterol Cal: 16 mg/dL (ref 5–40)

## 2017-02-24 LAB — COMPREHENSIVE METABOLIC PANEL
ALT: 14 IU/L (ref 0–32)
AST: 15 IU/L (ref 0–40)
Albumin/Globulin Ratio: 1.4 (ref 1.2–2.2)
Albumin: 4 g/dL (ref 3.5–5.5)
Alkaline Phosphatase: 92 IU/L (ref 39–117)
BUN/Creatinine Ratio: 13 (ref 9–23)
BUN: 9 mg/dL (ref 6–20)
Bilirubin Total: 0.3 mg/dL (ref 0.0–1.2)
CO2: 23 mmol/L (ref 20–29)
Calcium: 8.8 mg/dL (ref 8.7–10.2)
Chloride: 104 mmol/L (ref 96–106)
Creatinine, Ser: 0.68 mg/dL (ref 0.57–1.00)
GFR calc Af Amer: 139 mL/min/{1.73_m2} (ref >59)
GFR calc non Af Amer: 120 mL/min/{1.73_m2} (ref >59)
Globulin, Total: 2.8 g/dL (ref 1.5–4.5)
Glucose: 82 mg/dL (ref 65–99)
Potassium: 4.7 mmol/L (ref 3.5–5.2)
Sodium: 142 mmol/L (ref 134–144)
Total Protein: 6.8 g/dL (ref 6.0–8.5)

## 2017-02-24 LAB — ANEMIA PANEL
Ferritin: 11 ng/mL — ABNORMAL LOW (ref 15–150)
Folate, Hemolysate: 478.9 ng/mL
Folate, RBC: 1469 ng/mL (ref >498)
Hematocrit: 32.6 % — ABNORMAL LOW (ref 34.0–46.6)
Iron Saturation: 7 % — CL (ref 15–55)
Iron: 25 ug/dL — ABNORMAL LOW (ref 27–159)
Retic Ct Pct: 2.8 % — ABNORMAL HIGH (ref 0.6–2.6)
Total Iron Binding Capacity: 344 ug/dL (ref 250–450)
UIBC: 319 ug/dL (ref 131–425)
Vitamin B-12: 521 pg/mL (ref 232–1245)

## 2017-02-24 LAB — VITAMIN D 25 HYDROXY (VIT D DEFICIENCY, FRACTURES): Vit D, 25-Hydroxy: 16 ng/mL — ABNORMAL LOW (ref 30.0–100.0)

## 2017-02-24 LAB — T3: T3, Total: 111 ng/dL (ref 71–180)

## 2017-02-24 LAB — INSULIN, RANDOM: INSULIN: 17.7 u[IU]/mL (ref 2.6–24.9)

## 2017-02-24 LAB — T4, FREE: Free T4: 1.3 ng/dL (ref 0.82–1.77)

## 2017-02-24 LAB — HEMOGLOBIN A1C
Est. average glucose Bld gHb Est-mCnc: 103 mg/dL
Hgb A1c MFr Bld: 5.2 % (ref 4.8–5.6)

## 2017-02-24 LAB — TSH: TSH: 2.15 u[IU]/mL (ref 0.450–4.500)

## 2017-02-24 LAB — FOLATE: Folate: 11.6 ng/mL (ref >3.0)

## 2017-02-26 NOTE — Progress Notes (Signed)
.  Office: [phone]  /  Fax: [phone]   HPI:   Chief Complaint: OBESITY  Traci Finley (MR# [id number]) is a 26 y.o. female who presents on 02/26/2017 for obesity evaluation and treatment. Current BMI is Body mass index is 37.61 kg/m.Traci Finley has struggled with obesity for years and has been unsuccessful in either losing weight or maintaining long term weight loss. Traci Finley attended our information session and states she is currently in the action stage of change and ready to dedicate time achieving and maintaining a healthier weight.  Traci Finley states her desired weight loss is 64 lbs she has been heavy most of  her life she started gaining weight in elementary school her heaviest weight ever was 300 lbs. her family eats meals together she thinks her family will eat healthier with her she has significant food cravings issues  she snacks frequently in the evenings she wakes up frquently in the middle of the night to eat she skips meals frequently she is frequently drinking liquids with calories she frequently makes poor food choices she has problems with excessive hunger  she frequently eats larger portions than normal    Fatigue Traci Finley feels her energy is lower than it should be. This has worsened with weight gain and has not worsened recently. Traci Finley admits to daytime somnolence and  admits to waking up still tired. Patient is at risk for obstructive sleep apnea. Patent has a history of symptoms of daytime fatigue and morning headache. Patient generally gets 4 or 5 hours of sleep per night, and states they generally have difficulty falling asleep. Snoring is present. Apneic episodes are not present. Epworth Sleepiness Score is 1  Dyspnea on exertion Traci Finley notes increasing shortness of breath with exercising and seems to be worsening over time with weight gain. She notes getting out of breath sooner with activity than she used to. She has a history of asthma but has not had  problems recently. Traci Finley denies orthopnea.  Iron Deficiency Anemia  Traci Finley has a diagnosis of Iron Deficiency Anemia.  She notes a history of menorrhagia and is not on iron supplementation. She admits to having fatigue.  Depression Screen [name]'s Food and Mood (modified PHQ-9) score was  Depression screen PHQ 2/9 02/22/2017  Decreased Interest 3  Down, Depressed, Hopeless 3  PHQ - 2 Score 6  Altered sleeping 3  Tired, decreased energy 3  Change in appetite 3  Feeling bad or failure about yourself  3  Trouble concentrating 3  Moving slowly or fidgety/restless 1  Suicidal thoughts 0  PHQ-9 Score 22  Difficult doing work/chores Somewhat difficult    ALLERGIES: No Known Allergies  MEDICATIONS: No current outpatient prescriptions on file prior to visit.   No current facility-administered medications on file prior to visit.     PAST MEDICAL HISTORY: Past Medical History:  Diagnosis Date  . Anemia   . Anxiety   . Asthma   . Back pain   . Chest pain   . Depression   . Frequent headaches   . Joint pain   . Persistent headaches   . Shortness of breath     PAST SURGICAL HISTORY: Past Surgical History:  Procedure Laterality Date  . CORNEAL TRANSPLANT    . WISDOM TOOTH EXTRACTION      SOCIAL HISTORY: Social History  Substance Use Topics  . Smoking status: Former Smoker    Packs/day: 12.00    Years: 2.00    Quit date: 2015  . Smokeless tobacco: Never  Used  . Alcohol use No    FAMILY HISTORY: Family History  Problem Relation Age of Onset  . Arthritis Mother   . Hypertension Mother   . Cancer Mother        thyroid  . Thyroid disease Mother   . Obesity Mother   . Diabetes Maternal Uncle   . Arthritis Maternal Grandmother   . Hypertension Maternal Grandmother   . Heart disease Maternal Grandmother   . Diabetes Maternal Grandmother   . Cancer Maternal Grandmother        thyroid    ROS: Review of Systems  Constitutional: Positive for malaise/fatigue.  Negative for weight loss.  Eyes: Positive for blurred vision and double vision.       Vision changes Wears glasses or contacts Flashes of light Floaters   Respiratory: Positive for shortness of breath (with exertion).   Cardiovascular: Negative for orthopnea.       Calf/leg pain with walking Leg cramping  Musculoskeletal: Positive for back pain.       Muscle or joint pain   Neurological: Positive for headaches.  Endo/Heme/Allergies:       Excessive hunger Positive Menorrhagia   Psychiatric/Behavioral: Positive for depression.       Stress     PHYSICAL EXAM: Blood pressure 107/74, pulse 79, temperature 98.2 F (36.8 C), temperature source Oral, height  (1.651 m), weight 226 lb (102.5 kg), SpO2 98 %. Body mass index is 37.61 kg/m. Physical Exam  Constitutional: She is oriented to person, place, and time. She appears well-developed and well-nourished.  Cardiovascular: Normal rate.   Pulmonary/Chest: Effort normal.  Musculoskeletal: Normal range of motion.  Neurological: She is oriented to person, place, and time.  Skin: Skin is warm and dry.  Psychiatric: She has a normal mood and affect. Her behavior is normal.  Vitals reviewed.   RECENT LABS AND TESTS: BMET    Component Value Date/Time   NA 142 02/22/2017 1040   K 4.7 02/22/2017 1040   CL 104 02/22/2017 1040   CO2 23 02/22/2017 1040   GLUCOSE 82 02/22/2017 1040   GLUCOSE 107 (H) 12/02/2016 0358   BUN 9 02/22/2017 1040   CREATININE 0.68 02/22/2017 1040   CALCIUM 8.8 02/22/2017 1040   GFRNONAA 120 02/22/2017 1040   GFRAA 139 02/22/2017 1040   Lab Results  Component Value Date   HGBA1C 5.2 02/22/2017   Lab Results  Component Value Date   INSULIN 17.7 02/22/2017   CBC    Component Value Date/Time   WBC 4.0 02/22/2017 1040   WBC 6.8 12/02/2016 0358   RBC 3.88 02/22/2017 1040   RBC 3.73 (L) 12/02/2016 0358   HGB 10.8 (L) 02/22/2017 1040   HCT 32.6 (L) 02/22/2017 1040   PLT 413 (H) 12/02/2016 0358    MCV 84 02/22/2017 1040   MCH 27.8 02/22/2017 1040   MCH 26.3 12/02/2016 0358   MCHC 33.1 02/22/2017 1040   MCHC 31.0 12/02/2016 0358   RDW 15.7 (H) 02/22/2017 1040   LYMPHSABS 1.6 02/22/2017 1040   MONOABS 0.5 04/28/2014 1340   EOSABS 0.1 02/22/2017 1040   BASOSABS 0.0 02/22/2017 1040   Iron/TIBC/Ferritin/ %Sat    Component Value Date/Time   IRON 25 (L) 02/22/2017 1040   TIBC 344 02/22/2017 1040   FERRITIN 11 (L) 02/22/2017 1040   IRONPCTSAT 7 (LL) 02/22/2017 1040   Lipid Panel     Component Value Date/Time   CHOL 127 02/22/2017 1040   TRIG 80 02/22/2017 1040  HDL 34 (L) 02/22/2017 1040   CHOLHDL 4 10/27/2014 1712   VLDL 16.0 10/27/2014 1712   LDLCALC 77 02/22/2017 1040   Hepatic Function Panel     Component Value Date/Time   PROT 6.8 02/22/2017 1040   ALBUMIN 4.0 02/22/2017 1040   AST 15 02/22/2017 1040   ALT 14 02/22/2017 1040   ALKPHOS 92 02/22/2017 1040   BILITOT 0.3 02/22/2017 1040      Component Value Date/Time   TSH 2.150 02/22/2017 1040   TSH 2.62 07/03/2016 1404   TSH 2.88 11/04/2015 0856    ECG  shows NSR with a rate of 73 BPM INDIRECT CALORIMETER done today shows a VO2 of 186 and a REE of 1294. Her calculated basal metabolic rate is 6962 thus her basal metabolic rate is worse than expected.    ASSESSMENT AND PLAN: Other fatigue - Plan: EKG 12-Lead, Vitamin B12, CBC With Differential, Comprehensive metabolic panel, Folate, Hemoglobin A1c, Insulin, random, Lipid Panel With LDL/HDL Ratio, T3, T4, free, TSH, VITAMIN D 25 Hydroxy (Vit-D Deficiency, Fractures)  Shortness of breath on exertion  Other iron deficiency anemia - Plan: Anemia panel, CBC With Differential  Depression screening  Class 2 severe obesity with serious comorbidity and body mass index (BMI) of 37.0 to 37.9 in adult, unspecified obesity type (HCC)  PLAN:  Fatigue Bernese was informed that her fatigue may be related to obesity, depression or many other causes. Labs will be  ordered, and in the meanwhile Kendelle has agreed to work on diet, exercise and weight loss to help with fatigue. Proper sleep hygiene was discussed including the need for 7-8 hours of quality sleep each night. A sleep study was not ordered based on symptoms and Epworth score.  Dyspnea on exertion Maxine's shortness of breath appears to be obesity related and exercise induced. She has agreed to work on weight loss and gradually increase exercise to treat her exercise induced shortness of breath. If Nanci follows our instructions and loses weight without improvement of her shortness of breath, we will plan to refer to pulmonology. We will monitor this condition regularly. Ritu agrees to this plan.  Iron Deficiency Anemia The diagnosis of Iron Deficiency Anemia was discussed with Tachina and was explained in detail. She was given suggestions of iron rich foods and and iron supplement was not prescribed.   Depression Screen Kharlie had a strongly positive depression screening. Depression is commonly associated with obesity and often results in emotional eating behaviors. We will monitor this closely and work on CBT to help improve the non-hunger eating patterns. Referral to Psychology may be required if no improvement is seen as she continues in our clinic.  Obesity Nikkia is currently in the action stage of change and her goal is to continue with weight loss efforts She has agreed to follow the Category 2 plan Sayana has been instructed to work up to a goal of 150 minutes of combined cardio and strengthening exercise per week for weight loss and overall health benefits. We discussed the following Behavioral Modification Strategies today: increasing lean protein intake, decreasing simple carbohydrates  and work on meal planning and easy cooking plans  Sean has agreed to follow up with our clinic in 2 weeks. She was informed of the importance of frequent follow up visits to maximize her success with  intensive lifestyle modifications for her multiple health conditions. She was informed we would discuss her lab results at her next visit unless there is a critical issue that needs to  be addressed sooner. Charlet agreed to keep her next visit at the agreed upon time to discuss these results.  I, Burt Knack, am acting as transcriptionist for Quillian Quince, MD  I have reviewed the above documentation for accuracy and completeness, and I agree with the above. -Quillian Quince, MD            Today's visit was # 1 out of 22.  Starting weight: 226 lbs Starting date: 02/22/17 Today's weight : 226 lbs Today's date: 02/22/2017 Total lbs lost to date: 0 (Patients must lose 7 lbs in the first 6 months to continue with counseling)   ASK: We discussed the diagnosis of obesity with Dara Lords today and Janece agreed to give [place] permission to discuss obesity behavioral modification therapy today.  ASSESS: Theresea has the diagnosis of obesity and her BMI today is 37.61 Kalliopi is in the action stage of change   ADVISE: Angalena was educated on the multiple health risks of obesity as well as the benefit of weight loss to improve her health. She was advised of the need for long term treatment and the importance of lifestyle modifications.  AGREE: Multiple dietary modification options and treatment options were discussed and  Flannery agreed to follow the Category 2 plan We discussed the following Behavioral Modification Strategies today: increasing lean protein intake, decreasing simple carbohydrates  and work on meal planning and easy cooking plans

## 2017-03-08 ENCOUNTER — Ambulatory Visit (INDEPENDENT_AMBULATORY_CARE_PROVIDER_SITE_OTHER): Payer: BLUE CROSS/BLUE SHIELD | Admitting: Family Medicine

## 2017-03-08 VITALS — BP 110/74 | HR 71 | Temp 98.1°F | Ht 65.0 in | Wt 224.0 lb

## 2017-03-08 DIAGNOSIS — E559 Vitamin D deficiency, unspecified: Secondary | ICD-10-CM | POA: Diagnosis not present

## 2017-03-08 DIAGNOSIS — D508 Other iron deficiency anemias: Secondary | ICD-10-CM

## 2017-03-08 DIAGNOSIS — Z6837 Body mass index (BMI) 37.0-37.9, adult: Secondary | ICD-10-CM | POA: Diagnosis not present

## 2017-03-08 MED ORDER — FERROUS SULFATE 325 (65 FE) MG PO TABS: 325.0000 mg | ORAL_TABLET | Freq: Two times a day (BID) | ORAL | 0 refills | Status: DC

## 2017-03-08 MED ORDER — DOCUSATE SODIUM 100 MG PO CAPS: 100.0000 mg | ORAL_CAPSULE | Freq: Two times a day (BID) | ORAL | 0 refills | Status: DC

## 2017-03-08 MED ORDER — VITAMIN D (ERGOCALCIFEROL) 1.25 MG (50000 UNIT) PO CAPS: 50000.0000 [IU] | ORAL_CAPSULE | ORAL | 0 refills | Status: DC

## 2017-03-08 NOTE — Progress Notes (Signed)
Office: [phone]  /  Fax: [phone]   HPI:   Chief Complaint: OBESITY Traci Finley is here to discuss her progress with her obesity treatment plan. She is on the Category 2 plan and is following her eating plan approximately 60 % of the time. She states she is exercising 0 minutes 0 times per week. Traci Finley did well with weight loss and followed breakfast well but struggled with meal plan especially for dinner. She had temptations at work and was tempted to eat out with co-workers.  Her weight is 224 lb (101.6 kg) today and has had a weight loss of 2 pounds over a period of 2 weeks since her last visit. She has lost 2 lbs since starting treatment with [place].  Iron Deficiency Anemia Traci Finley has a diagnosis of iron deficiency anemia. Her hemoglobin is low and she notes fatigue but denies palpitations and is not on iron supplementation.   Vitamin D deficiency Traci Finley has a diagnosis of vitamin D deficiency. She is not currently taking Vit D and she notes fatigue but denies nausea, vomiting or muscle weakness.  ALLERGIES: No Known Allergies  MEDICATIONS: No current outpatient prescriptions on file prior to visit.   No current facility-administered medications on file prior to visit.     PAST MEDICAL HISTORY: Past Medical History:  Diagnosis Date  . Anemia   . Anxiety   . Asthma   . Back pain   . Chest pain   . Depression   . Frequent headaches   . Joint pain   . Persistent headaches   . Shortness of breath     PAST SURGICAL HISTORY: Past Surgical History:  Procedure Laterality Date  . CORNEAL TRANSPLANT    . WISDOM TOOTH EXTRACTION      SOCIAL HISTORY: Social History  Substance Use Topics  . Smoking status: Former Smoker    Packs/day: 12.00    Years: 2.00    Quit date: 2015  . Smokeless tobacco: Never Used  . Alcohol use No    FAMILY HISTORY: Family History  Problem Relation Age of Onset  . Arthritis Mother   . Hypertension Mother   . Cancer Mother    thyroid  . Thyroid disease Mother   . Obesity Mother   . Diabetes Maternal Uncle   . Arthritis Maternal Grandmother   . Hypertension Maternal Grandmother   . Heart disease Maternal Grandmother   . Diabetes Maternal Grandmother   . Cancer Maternal Grandmother        thyroid    ROS: Review of Systems  Constitutional: Positive for malaise/fatigue and weight loss.  Cardiovascular: Negative for palpitations.  Gastrointestinal: Negative for nausea and vomiting.  Musculoskeletal:       Negative muscle weakness    PHYSICAL EXAM: Blood pressure 110/74, pulse 71, temperature 98.1 F (36.7 C), temperature source Oral, height 5\' 5"  (1.651 m), weight 224 lb (101.6 kg), last menstrual period 02/15/2017, SpO2 98 %. Body mass index is 37.28 kg/m. Physical Exam  Constitutional: She is oriented to person, place, and time. She appears well-developed and well-nourished.  Cardiovascular: Normal rate.   Pulmonary/Chest: Effort normal.  Musculoskeletal: Normal range of motion.  Neurological: She is oriented to person, place, and time.  Skin: Skin is warm and dry.  Psychiatric: She has a normal mood and affect. Her behavior is normal.  Vitals reviewed.   RECENT LABS AND TESTS: BMET    Component Value Date/Time   NA 142 02/22/2017 1040   K 4.7 02/22/2017 1040  CL 104 02/22/2017 1040   CO2 23 02/22/2017 1040   GLUCOSE 82 02/22/2017 1040   GLUCOSE 107 (H) 12/02/2016 0358   BUN 9 02/22/2017 1040   CREATININE 0.68 02/22/2017 1040   CALCIUM 8.8 02/22/2017 1040   GFRNONAA 120 02/22/2017 1040   GFRAA 139 02/22/2017 1040   Lab Results  Component Value Date   HGBA1C 5.2 02/22/2017   HGBA1C 5.4 07/03/2016   HGBA1C 5.1 10/27/2014   Lab Results  Component Value Date   INSULIN 17.7 02/22/2017   CBC    Component Value Date/Time   WBC 4.0 02/22/2017 1040   WBC 6.8 12/02/2016 0358   RBC 3.88 02/22/2017 1040   RBC 3.73 (L) 12/02/2016 0358   HGB 10.8 (L) 02/22/2017 1040   HCT 32.6 (L)  02/22/2017 1040   PLT 413 (H) 12/02/2016 0358   MCV 84 02/22/2017 1040   MCH 27.8 02/22/2017 1040   MCH 26.3 12/02/2016 0358   MCHC 33.1 02/22/2017 1040   MCHC 31.0 12/02/2016 0358   RDW 15.7 (H) 02/22/2017 1040   LYMPHSABS 1.6 02/22/2017 1040   MONOABS 0.5 04/28/2014 1340   EOSABS 0.1 02/22/2017 1040   BASOSABS 0.0 02/22/2017 1040   Iron/TIBC/Ferritin/ %Sat    Component Value Date/Time   IRON 25 (L) 02/22/2017 1040   TIBC 344 02/22/2017 1040   FERRITIN 11 (L) 02/22/2017 1040   IRONPCTSAT 7 (LL) 02/22/2017 1040   Lipid Panel     Component Value Date/Time   CHOL 127 02/22/2017 1040   TRIG 80 02/22/2017 1040   HDL 34 (L) 02/22/2017 1040   CHOLHDL 4 10/27/2014 1712   VLDL 16.0 10/27/2014 1712   LDLCALC 77 02/22/2017 1040   Hepatic Function Panel     Component Value Date/Time   PROT 6.8 02/22/2017 1040   ALBUMIN 4.0 02/22/2017 1040   AST 15 02/22/2017 1040   ALT 14 02/22/2017 1040   ALKPHOS 92 02/22/2017 1040   BILITOT 0.3 02/22/2017 1040      Component Value Date/Time   TSH 2.150 02/22/2017 1040   TSH 2.62 07/03/2016 1404   TSH 2.88 11/04/2015 0856    ASSESSMENT AND PLAN: Other iron deficiency anemia - Plan: docusate sodium (COLACE) 100 MG capsule, ferrous sulfate 325 (65 FE) MG tablet  Vitamin D deficiency - Plan: Vitamin D, Ergocalciferol, (DRISDOL) 50000 units CAPS capsule  Class 2 severe obesity with serious comorbidity and body mass index (BMI) of 37.0 to 37.9 in adult, unspecified obesity type (HCC)  PLAN:  Iron Deficiency Anemia The diagnosis of Iron deficiency anemia was discussed with Traci SheldonAshley and was explained in detail. She was given suggestions of iron rich foods. Traci Finley agrees to start Feosol 325 mg BID #60 with no refills, and start Colace 100 mg BID #60 with no refills. Traci Finley agrees to follow up with our clinic in 2 weeks.  Vitamin D Deficiency Traci Finley was informed that low vitamin D levels contributes to fatigue and are associated with obesity,  breast, and colon cancer. Traci Finley agrees to start prescription Vit D @50 ,000 IU every week #4 with no refills and will follow up for routine testing of vitamin D, at least 2-3 times per year. She was informed of the risk of over-replacement of vitamin D and agrees to not increase her dose unless he discusses this with us first. Traci Finley agrees to follow up with our clinic in 2 weeks.  Obesity Traci Finley is currently in the action stage of change. As such, her goal is to continue with  weight loss efforts She has agreed to change to keep a food journal with 350-500 calories and 35+ grams of protein at supper daily and follow the Category 2 plan Burdette has been instructed to work up to a goal of 150 minutes of combined cardio and strengthening exercise per week for weight loss and overall health benefits. We discussed the following Behavioral Modification Strategies today: increasing lean protein intake, decreasing simple carbohydrates, work on meal planning and easy cooking plans, keeping healthy foods in the home, and planning for success   Letanya has agreed to follow up with our clinic in 2 weeks. She was informed of the importance of frequent follow up visits to maximize her success with intensive lifestyle modifications for her multiple health conditions.  I, Burt Knack, am acting as transcriptionist for Quillian Quince, MD  I have reviewed the above documentation for accuracy and completeness, and I agree with the above. -Quillian Quince, MD     Today's visit was # 2 out of 22.  Starting weight: 226 lbs Starting date: 02/22/17 Today's weight : 224 lbs  Today's date: 03/08/2017 Total lbs lost to date: 2 (Patients must lose 7 lbs in the first 6 months to continue with counseling)   ASK: We discussed the diagnosis of obesity with Traci Finley today and Traci Finley agreed to give [place] permission to discuss obesity behavioral modification therapy today.  ASSESS: Traci Finley has the diagnosis of obesity  and her BMI today is 37.28 Traci Finley is in the action stage of change   ADVISE: Torsha was educated on the multiple health risks of obesity as well as the benefit of weight loss to improve her health. She was advised of the need for long term treatment and the importance of lifestyle modifications.  AGREE: Multiple dietary modification options and treatment options were discussed and  Claire agreed to keep a food journal with 350-500 calories and 35+ grams of protein at supper daily and follow the Category 2 plan We discussed the following Behavioral Modification Strategies today: increasing lean protein intake, decreasing simple carbohydrates, work on meal planning and easy cooking plans, keeping healthy foods in the home, and planning for success.

## 2017-03-21 ENCOUNTER — Ambulatory Visit: Payer: BLUE CROSS/BLUE SHIELD | Admitting: Neurology

## 2017-03-22 ENCOUNTER — Ambulatory Visit (INDEPENDENT_AMBULATORY_CARE_PROVIDER_SITE_OTHER): Payer: BLUE CROSS/BLUE SHIELD | Admitting: Family Medicine

## 2017-03-22 VITALS — BP 119/80 | HR 80 | Temp 98.1°F | Ht 65.0 in | Wt 225.0 lb

## 2017-03-22 DIAGNOSIS — Z6837 Body mass index (BMI) 37.0-37.9, adult: Secondary | ICD-10-CM | POA: Diagnosis not present

## 2017-03-22 DIAGNOSIS — K5909 Other constipation: Secondary | ICD-10-CM | POA: Diagnosis not present

## 2017-03-22 MED ORDER — DOCUSATE SODIUM 100 MG PO CAPS: 100.0000 mg | ORAL_CAPSULE | Freq: Two times a day (BID) | ORAL | 0 refills | Status: DC

## 2017-03-22 NOTE — Progress Notes (Signed)
Office: [phone]  /  Fax: [phone]   HPI:   Chief Complaint: OBESITY Traci Finley is here to discuss her progress with her obesity treatment plan. She is on the keep a food journal with 350-500 calories and 35+ grams of protein at supper daily and follow the Category 2 plan and is following her eating plan approximately 40 % of the time. She states she is exercising 0 minutes 0 times per week. Traci Finley was more off track over Halloween and while going through PMS and increased snacking. She is also working a second job and not able to plan dinner as well, so she has increased eating out.  Her weight is 225 lb (102.1 kg) today and has gained 1 pound since her last visit. She has lost 1 lbs since starting treatment with us.  Constipation Traci Finley notes constipation for the last few weeks, worse since attempting weight loss. She is on iron supplementation and this causes more hard BM, but improving with Colace. She requests a refill. She denies hematochezia or melena. She admits to drinking less H20 recently.  ALLERGIES: No Known Allergies  MEDICATIONS: Current Outpatient Medications on File Prior to Visit  Medication Sig Dispense Refill  . docusate sodium (COLACE) 100 MG capsule Take 1 capsule (100 mg total) by mouth 2 (two) times daily. 10 capsule 0  . ferrous sulfate 325 (65 FE) MG tablet Take 1 tablet (325 mg total) by mouth 2 (two) times daily with a meal. 60 tablet 0  . Vitamin D, Ergocalciferol, (DRISDOL) 50000 units CAPS capsule Take 1 capsule (50,000 Units total) by mouth every 7 (seven) days. 4 capsule 0   No current facility-administered medications on file prior to visit.     PAST MEDICAL HISTORY: Past Medical History:  Diagnosis Date  . Anemia   . Anxiety   . Asthma   . Back pain   . Chest pain   . Depression   . Frequent headaches   . Joint pain   . Persistent headaches   . Shortness of breath     PAST SURGICAL HISTORY: Past Surgical History:  Procedure  Laterality Date  . CORNEAL TRANSPLANT    . WISDOM TOOTH EXTRACTION      SOCIAL HISTORY: Social History   Tobacco Use  . Smoking status: Former Smoker    Packs/day: 12.00    Years: 2.00    Pack years: 24.00    Last attempt to quit: 2015    Years since quitting: 3.8  . Smokeless tobacco: Never Used  Substance Use Topics  . Alcohol use: No  . Drug use: No    FAMILY HISTORY: Family History  Problem Relation Age of Onset  . Arthritis Mother   . Hypertension Mother   . Cancer Mother        thyroid  . Thyroid disease Mother   . Obesity Mother   . Diabetes Maternal Uncle   . Arthritis Maternal Grandmother   . Hypertension Maternal Grandmother   . Heart disease Maternal Grandmother   . Diabetes Maternal Grandmother   . Cancer Maternal Grandmother        thyroid    ROS: Review of Systems  Constitutional: Negative for weight loss.  Gastrointestinal: Positive for constipation. Negative for melena.       Negative hematochezia    PHYSICAL EXAM: Blood pressure 119/80, pulse 80, temperature 98.1 F (36.7 C), temperature source Oral, height 5\' 5"  (1.651 m), weight 225 lb (102.1 kg), last menstrual period 03/15/2017, SpO2 99 %.  Body mass index is 37.44 kg/m. Physical Exam  Constitutional: She is oriented to person, place, and time. She appears well-developed and well-nourished.  Cardiovascular: Normal rate.  Pulmonary/Chest: Effort normal.  Musculoskeletal: Normal range of motion.  Neurological: She is oriented to person, place, and time.  Skin: Skin is warm and dry.  Psychiatric: She has a normal mood and affect. Her behavior is normal.  Vitals reviewed.   RECENT LABS AND TESTS: BMET    Component Value Date/Time   NA 142 02/22/2017 1040   K 4.7 02/22/2017 1040   CL 104 02/22/2017 1040   CO2 23 02/22/2017 1040   GLUCOSE 82 02/22/2017 1040   GLUCOSE 107 (H) 12/02/2016 0358   BUN 9 02/22/2017 1040   CREATININE 0.68 02/22/2017 1040   CALCIUM 8.8 02/22/2017 1040    GFRNONAA 120 02/22/2017 1040   GFRAA 139 02/22/2017 1040   Lab Results  Component Value Date   HGBA1C 5.2 02/22/2017   HGBA1C 5.4 07/03/2016   HGBA1C 5.1 10/27/2014   Lab Results  Component Value Date   INSULIN 17.7 02/22/2017   CBC    Component Value Date/Time   WBC 4.0 02/22/2017 1040   WBC 6.8 12/02/2016 0358   RBC 3.88 02/22/2017 1040   RBC 3.73 (L) 12/02/2016 0358   HGB 10.8 (L) 02/22/2017 1040   HCT 32.6 (L) 02/22/2017 1040   PLT 413 (H) 12/02/2016 0358   MCV 84 02/22/2017 1040   MCH 27.8 02/22/2017 1040   MCH 26.3 12/02/2016 0358   MCHC 33.1 02/22/2017 1040   MCHC 31.0 12/02/2016 0358   RDW 15.7 (H) 02/22/2017 1040   LYMPHSABS 1.6 02/22/2017 1040   MONOABS 0.5 04/28/2014 1340   EOSABS 0.1 02/22/2017 1040   BASOSABS 0.0 02/22/2017 1040   Iron/TIBC/Ferritin/ %Sat    Component Value Date/Time   IRON 25 (L) 02/22/2017 1040   TIBC 344 02/22/2017 1040   FERRITIN 11 (L) 02/22/2017 1040   IRONPCTSAT 7 (LL) 02/22/2017 1040   Lipid Panel     Component Value Date/Time   CHOL 127 02/22/2017 1040   TRIG 80 02/22/2017 1040   HDL 34 (L) 02/22/2017 1040   CHOLHDL 4 10/27/2014 1712   VLDL 16.0 10/27/2014 1712   LDLCALC 77 02/22/2017 1040   Hepatic Function Panel     Component Value Date/Time   PROT 6.8 02/22/2017 1040   ALBUMIN 4.0 02/22/2017 1040   AST 15 02/22/2017 1040   ALT 14 02/22/2017 1040   ALKPHOS 92 02/22/2017 1040   BILITOT 0.3 02/22/2017 1040      Component Value Date/Time   TSH 2.150 02/22/2017 1040   TSH 2.62 07/03/2016 1404   TSH 2.88 11/04/2015 0856    ASSESSMENT AND PLAN: Other constipation  Class 2 severe obesity with serious comorbidity and body mass index (BMI) of 37.0 to 37.9 in adult, unspecified obesity type (HCC)  PLAN:  Constipation Keisa was informed decrease bowel movement frequency is normal while losing weight, but stools should not be hard or painful. She was advised to increase her H20 intake and work on increasing her  fiber intake. High fiber foods were discussed today. Traci Sheldon agree to take Colace 100 mg BID #60 with no refills and she agrees to follow up with our clinic in 3 weeks.  Obesity Jaionna is currently in the action stage of change. As such, her goal is to continue with weight loss efforts She has agreed to keep a food journal with 400-550 calories and 35+ grams of  protein at supper daily and follow the Category 2 plan Traci Finley has been instructed to work up to a goal of 150 minutes of combined cardio and strengthening exercise per week for weight loss and overall health benefits. We discussed the following Behavioral Modification Strategies today: increasing lean protein intake, increasing vegetables, decrease eating out, holiday eating strategies, no skipping meals, and travel eating strategies.  Traci Finley was given a handout for smarter eating out choices.  Traci Finley has agreed to follow up with our clinic in 3 weeks. She was informed of the importance of frequent follow up visits to maximize her success with intensive lifestyle modifications for her multiple health conditions.  I, Burt KnackSharon Johnasia Liese, am acting as transcriptionist for Quillian Quincearen Beasley, MD  I have reviewed the above documentation for accuracy and completeness, and I agree with the above. -Quillian Quincearen Beasley, MD      Today's visit was # 3 out of 22.  Starting weight: 226 lbs Starting date: 02/22/17 Today's weight : 225 lbs  Today's date: 03/22/2017 Total lbs lost to date: 1 (Patients must lose 7 lbs in the first 6 months to continue with counseling)   ASK: We discussed the diagnosis of obesity with Dara LordsAshley Callaham today and Traci Finley agreed to give us permission to discuss obesity behavioral modification therapy today.  ASSESS: Traci Finley has the diagnosis of obesity and her BMI today is 37.44 Traci Finley is in the action stage of change   ADVISE: Traci Finley was educated on the multiple health risks of obesity as well as the benefit of weight loss to  improve her health. She was advised of the need for long term treatment and the importance of lifestyle modifications.  AGREE: Multiple dietary modification options and treatment options were discussed and  Traci Finley agreed to keep a food journal with 400-550 calories and 35+ grams of protein at supper daily and follow the Category 2 plan We discussed the following Behavioral Modification Strategies today: increasing lean protein intake, increasing vegetables, decrease eating out, holiday eating strategies, no skipping meals, and travel eating strategies

## 2017-04-02 ENCOUNTER — Other Ambulatory Visit (INDEPENDENT_AMBULATORY_CARE_PROVIDER_SITE_OTHER): Payer: Self-pay | Admitting: Family Medicine

## 2017-04-02 DIAGNOSIS — E559 Vitamin D deficiency, unspecified: Secondary | ICD-10-CM

## 2017-04-12 ENCOUNTER — Ambulatory Visit (INDEPENDENT_AMBULATORY_CARE_PROVIDER_SITE_OTHER): Payer: BLUE CROSS/BLUE SHIELD | Admitting: Family Medicine

## 2017-04-12 VITALS — BP 118/77 | HR 74 | Temp 97.6°F | Ht 66.0 in | Wt 221.0 lb

## 2017-04-12 DIAGNOSIS — E559 Vitamin D deficiency, unspecified: Secondary | ICD-10-CM

## 2017-04-12 DIAGNOSIS — Z6836 Body mass index (BMI) 36.0-36.9, adult: Secondary | ICD-10-CM | POA: Diagnosis not present

## 2017-04-12 DIAGNOSIS — D509 Iron deficiency anemia, unspecified: Secondary | ICD-10-CM

## 2017-04-12 MED ORDER — VITAMIN D (ERGOCALCIFEROL) 1.25 MG (50000 UNIT) PO CAPS: 50000.0000 [IU] | ORAL_CAPSULE | ORAL | 0 refills | Status: DC

## 2017-04-12 NOTE — Progress Notes (Signed)
Office: [phone]  /  Fax: [phone]   HPI:   Chief Complaint: OBESITY Traci Finley is here to discuss her progress with her obesity treatment plan. She is on the  keep a food journal with 400-550 calories and 35+ grams of protein at supper daily and follow the Category 2 plan and is following her eating plan approximately 50 % of the time. She states she is exercising 0 minutes 0 times per week. Traci Finley continues to do well with weight loss even over Thanksgiving. She did well with portion control and making smarter food choices and has been following her plan closely afterwards.  Her weight is 221 lb (100.2 kg) today and has had a weight loss of 4 pounds over a period of 3 weeks since her last visit. She has lost 5 lbs since starting treatment with us.  Iron Deficiency Anemia Traci Finley has a diagnosis of anemia. She is on iron supplementation and Colace and had some initial constipation but has now improved.   Vitamin D deficiency Traci Finley has a diagnosis of vitamin D deficiency. She is stable on prescription Vit D, but not yet at goal. She denies nausea, vomiting or muscle weakness.  ALLERGIES: No Known Allergies  MEDICATIONS: Current Outpatient Medications on File Prior to Visit  Medication Sig Dispense Refill  . docusate sodium (COLACE) 100 MG capsule Take 1 capsule (100 mg total) 2 (two) times daily by mouth. 60 capsule 0  . ferrous sulfate 325 (65 FE) MG tablet Take 1 tablet (325 mg total) by mouth 2 (two) times daily with a meal. 60 tablet 0  . Vitamin D, Ergocalciferol, (DRISDOL) 50000 units CAPS capsule TAKE 1 CAPSULE (50,000 UNITS TOTAL) BY MOUTH EVERY 7 (SEVEN) DAYS. 4 capsule 0   No current facility-administered medications on file prior to visit.     PAST MEDICAL HISTORY: Past Medical History:  Diagnosis Date  . Anemia   . Anxiety   . Asthma   . Back pain   . Chest pain   . Depression   . Frequent headaches   . Joint pain   . Persistent headaches   . Shortness of  breath     PAST SURGICAL HISTORY: Past Surgical History:  Procedure Laterality Date  . CORNEAL TRANSPLANT    . WISDOM TOOTH EXTRACTION      SOCIAL HISTORY: Social History   Tobacco Use  . Smoking status: Former Smoker    Packs/day: 12.00    Years: 2.00    Pack years: 24.00    Last attempt to quit: 2015    Years since quitting: 3.9  . Smokeless tobacco: Never Used  Substance Use Topics  . Alcohol use: No  . Drug use: No    FAMILY HISTORY: Family History  Problem Relation Age of Onset  . Arthritis Mother   . Hypertension Mother   . Cancer Mother        thyroid  . Thyroid disease Mother   . Obesity Mother   . Diabetes Maternal Uncle   . Arthritis Maternal Grandmother   . Hypertension Maternal Grandmother   . Heart disease Maternal Grandmother   . Diabetes Maternal Grandmother   . Cancer Maternal Grandmother        thyroid    ROS: Review of Systems  Constitutional: Positive for weight loss.  Gastrointestinal: Positive for constipation. Negative for nausea and vomiting.  Musculoskeletal:       Negative muscle weakness    PHYSICAL EXAM: Blood pressure 118/77, pulse 74, temperature 97.6  F (36.4 C), temperature source Oral, height 5\' 6"  (1.676 m), weight 221 lb (100.2 kg), last menstrual period 03/15/2017, SpO2 98 %. Body mass index is 35.67 kg/m. Physical Exam  Constitutional: She is oriented to person, place, and time. She appears well-developed and well-nourished.  Cardiovascular: Normal rate.  Pulmonary/Chest: Effort normal.  Musculoskeletal: Normal range of motion.  Neurological: She is oriented to person, place, and time.  Skin: Skin is warm and dry.  Psychiatric: She has a normal mood and affect. Her behavior is normal.  Vitals reviewed.   RECENT LABS AND TESTS: BMET    Component Value Date/Time   NA 142 02/22/2017 1040   K 4.7 02/22/2017 1040   CL 104 02/22/2017 1040   CO2 23 02/22/2017 1040   GLUCOSE 82 02/22/2017 1040   GLUCOSE 107 (H)  12/02/2016 0358   BUN 9 02/22/2017 1040   CREATININE 0.68 02/22/2017 1040   CALCIUM 8.8 02/22/2017 1040   GFRNONAA 120 02/22/2017 1040   GFRAA 139 02/22/2017 1040   Lab Results  Component Value Date   HGBA1C 5.2 02/22/2017   HGBA1C 5.4 07/03/2016   HGBA1C 5.1 10/27/2014   Lab Results  Component Value Date   INSULIN 17.7 02/22/2017   CBC    Component Value Date/Time   WBC 4.0 02/22/2017 1040   WBC 6.8 12/02/2016 0358   RBC 3.88 02/22/2017 1040   RBC 3.73 (L) 12/02/2016 0358   HGB 10.8 (L) 02/22/2017 1040   HCT 32.6 (L) 02/22/2017 1040   PLT 413 (H) 12/02/2016 0358   MCV 84 02/22/2017 1040   MCH 27.8 02/22/2017 1040   MCH 26.3 12/02/2016 0358   MCHC 33.1 02/22/2017 1040   MCHC 31.0 12/02/2016 0358   RDW 15.7 (H) 02/22/2017 1040   LYMPHSABS 1.6 02/22/2017 1040   MONOABS 0.5 04/28/2014 1340   EOSABS 0.1 02/22/2017 1040   BASOSABS 0.0 02/22/2017 1040   Iron/TIBC/Ferritin/ %Sat    Component Value Date/Time   IRON 25 (L) 02/22/2017 1040   TIBC 344 02/22/2017 1040   FERRITIN 11 (L) 02/22/2017 1040   IRONPCTSAT 7 (LL) 02/22/2017 1040   Lipid Panel     Component Value Date/Time   CHOL 127 02/22/2017 1040   TRIG 80 02/22/2017 1040   HDL 34 (L) 02/22/2017 1040   CHOLHDL 4 10/27/2014 1712   VLDL 16.0 10/27/2014 1712   LDLCALC 77 02/22/2017 1040   Hepatic Function Panel     Component Value Date/Time   PROT 6.8 02/22/2017 1040   ALBUMIN 4.0 02/22/2017 1040   AST 15 02/22/2017 1040   ALT 14 02/22/2017 1040   ALKPHOS 92 02/22/2017 1040   BILITOT 0.3 02/22/2017 1040      Component Value Date/Time   TSH 2.150 02/22/2017 1040   TSH 2.62 07/03/2016 1404   TSH 2.88 11/04/2015 0856    ASSESSMENT AND PLAN: Vitamin D deficiency - Plan: Vitamin D, Ergocalciferol, (DRISDOL) 50000 units CAPS capsule  Iron deficiency anemia, unspecified iron deficiency anemia type  Class 2 severe obesity with serious comorbidity and body mass index (BMI) of 36.0 to 36.9 in adult,  unspecified obesity type (HCC)  PLAN:  Iron Deficiency Anemia The diagnosis of Iron deficiency anemia was discussed with Traci Finley and was explained in detail. Traci Finley agrees to continue taking iron supplement and Colace and we will recheck labs in 1 month. Traci Finley agrees to follow up with our clinic in 2 weeks.  Vitamin D Deficiency Loany was informed that low vitamin D levels contributes to  fatigue and are associated with obesity, breast, and colon cancer. Traci Finley agrees to continue taking prescription Vit D @50 ,000 IU every week #4 and we will refill for 1 month. She will follow up for routine testing of vitamin D, at least 2-3 times per year. She was informed of the risk of over-replacement of vitamin D and agrees to not increase her dose unless he discusses this with us first. We will recheck labs in 1 month and Traci Finley agrees to follow up with our clinic in 2 weeks.  Obesity Traci Finley is currently in the action stage of change. As such, her goal is to continue with weight loss efforts She has agreed to follow the Category 2 plan Traci Finley has been instructed to work up to a goal of 150 minutes of combined cardio and strengthening exercise per week or 15 minutes of cardio daily for weight loss and overall health benefits. We discussed the following Behavioral Modification Strategies today: increasing lean protein intake, decreasing simple carbohydrates, work on meal planning and easy cooking plans, dealing with family or coworker sabotage, and holiday eating strategies    Traci Finley has agreed to follow up with our clinic in 2 weeks. She was informed of the importance of frequent follow up visits to maximize her success with intensive lifestyle modifications for her multiple health conditions.  I, Burt KnackSharon Martin, am acting as transcriptionist for Quillian Quincearen Miken Stecher, MD  I have reviewed the above documentation for accuracy and completeness, and I agree with the above. -Quillian Quincearen Grayden Burley, MD     Today's visit  was # 4 out of 22.  Starting weight: 226 lbs Starting date: 02/22/17 Today's weight : 221 lbs  Today's date: 04/12/2017 Total lbs lost to date: 5 (Patients must lose 7 lbs in the first 6 months to continue with counseling)   ASK: We discussed the diagnosis of obesity with Dara LordsAshley Bown today and Traci Finley agreed to give us permission to discuss obesity behavioral modification therapy today.  ASSESS: Traci Finley has the diagnosis of obesity and her BMI today is 35.69 Traci Finley is in the action stage of change   ADVISE: Traci Finley was educated on the multiple health risks of obesity as well as the benefit of weight loss to improve her health. She was advised of the need for long term treatment and the importance of lifestyle modifications.  AGREE: Multiple dietary modification options and treatment options were discussed and  Traci Finley agreed to follow the Category 2 plan We discussed the following Behavioral Modification Strategies today: increasing lean protein intake, decreasing simple carbohydrates, work on meal planning and easy cooking plans, dealing with family or coworker sabotage, and holiday eating strategies

## 2017-04-24 ENCOUNTER — Ambulatory Visit: Payer: BLUE CROSS/BLUE SHIELD | Admitting: Neurology

## 2017-04-26 ENCOUNTER — Ambulatory Visit (INDEPENDENT_AMBULATORY_CARE_PROVIDER_SITE_OTHER): Payer: BLUE CROSS/BLUE SHIELD | Admitting: Family Medicine

## 2017-04-26 ENCOUNTER — Encounter (INDEPENDENT_AMBULATORY_CARE_PROVIDER_SITE_OTHER): Payer: Self-pay

## 2017-05-02 DIAGNOSIS — Z947 Corneal transplant status: Secondary | ICD-10-CM | POA: Diagnosis not present

## 2017-05-02 DIAGNOSIS — H18603 Keratoconus, unspecified, bilateral: Secondary | ICD-10-CM | POA: Diagnosis not present

## 2017-05-16 ENCOUNTER — Ambulatory Visit (INDEPENDENT_AMBULATORY_CARE_PROVIDER_SITE_OTHER): Payer: BLUE CROSS/BLUE SHIELD | Admitting: Family Medicine

## 2017-05-16 VITALS — BP 120/77 | HR 68 | Temp 97.3°F | Ht 66.0 in | Wt 224.0 lb

## 2017-05-16 DIAGNOSIS — E559 Vitamin D deficiency, unspecified: Secondary | ICD-10-CM

## 2017-05-16 DIAGNOSIS — Z6836 Body mass index (BMI) 36.0-36.9, adult: Secondary | ICD-10-CM | POA: Diagnosis not present

## 2017-05-16 MED ORDER — VITAMIN D (ERGOCALCIFEROL) 1.25 MG (50000 UNIT) PO CAPS: 50000.0000 [IU] | ORAL_CAPSULE | ORAL | 0 refills | Status: DC

## 2017-05-16 NOTE — Progress Notes (Signed)
Office: [phone]  /  Fax: [phone]   HPI:   Chief Complaint: OBESITY Traci Finley is here to discuss her progress with her obesity treatment plan. She is on the Category 2 plan and is following her eating plan approximately 50 % of the time. She states she is walking for 30 minutes 3 times per week. Traci Finley had increase celebration eating over the holidays but she is now ready to get back on track. She would like to discuss other options.  Her weight is 224 lb (101.6 kg) today and has gained 3 pounds since her last visit. She has lost 2 lbs since starting treatment with us.  Vitamin D deficiency Traci Finley has a diagnosis of vitamin D deficiency. She is on prescription Vit D, last level low at 16. She denies nausea, vomiting or muscle weakness.  ALLERGIES: No Known Allergies  MEDICATIONS: Current Outpatient Medications on File Prior to Visit  Medication Sig Dispense Refill  . docusate sodium (COLACE) 100 MG capsule Take 1 capsule (100 mg total) 2 (two) times daily by mouth. 60 capsule 0  . ferrous sulfate 325 (65 FE) MG tablet Take 1 tablet (325 mg total) by mouth 2 (two) times daily with a meal. 60 tablet 0  . Vitamin D, Ergocalciferol, (DRISDOL) 50000 units CAPS capsule Take 1 capsule (50,000 Units total) by mouth every 7 (seven) days. 4 capsule 0   No current facility-administered medications on file prior to visit.     PAST MEDICAL HISTORY: Past Medical History:  Diagnosis Date  . Anemia   . Anxiety   . Asthma   . Back pain   . Chest pain   . Depression   . Frequent headaches   . Joint pain   . Persistent headaches   . Shortness of breath     PAST SURGICAL HISTORY: Past Surgical History:  Procedure Laterality Date  . CORNEAL TRANSPLANT    . WISDOM TOOTH EXTRACTION      SOCIAL HISTORY: Social History   Tobacco Use  . Smoking status: Former Smoker    Packs/day: 12.00    Years: 2.00    Pack years: 24.00    Last attempt to quit: 2015    Years since quitting:  4.0  . Smokeless tobacco: Never Used  Substance Use Topics  . Alcohol use: No  . Drug use: No    FAMILY HISTORY: Family History  Problem Relation Age of Onset  . Arthritis Mother   . Hypertension Mother   . Cancer Mother        thyroid  . Thyroid disease Mother   . Obesity Mother   . Diabetes Maternal Uncle   . Arthritis Maternal Grandmother   . Hypertension Maternal Grandmother   . Heart disease Maternal Grandmother   . Diabetes Maternal Grandmother   . Cancer Maternal Grandmother        thyroid    ROS: Review of Systems  Constitutional: Negative for weight loss.  Gastrointestinal: Negative for nausea and vomiting.  Musculoskeletal:       Negative muscle weakness    PHYSICAL EXAM: Blood pressure 120/77, pulse 68, temperature (!) 97.3 F (36.3 C), temperature source Oral, height 5\' 6"  (1.676 m), weight 224 lb (101.6 kg), last menstrual period 05/10/2017, SpO2 97 %. Body mass index is 36.15 kg/m. Physical Exam  Constitutional: She is oriented to person, place, and time. She appears well-developed and well-nourished.  Cardiovascular: Normal rate.  Pulmonary/Chest: Effort normal.  Musculoskeletal: Normal range of motion.  Neurological: She  is oriented to person, place, and time.  Skin: Skin is warm and dry.  Psychiatric: She has a normal mood and affect. Her behavior is normal.  Vitals reviewed.   RECENT LABS AND TESTS: BMET    Component Value Date/Time   NA 142 02/22/2017 1040   K 4.7 02/22/2017 1040   CL 104 02/22/2017 1040   CO2 23 02/22/2017 1040   GLUCOSE 82 02/22/2017 1040   GLUCOSE 107 (H) 12/02/2016 0358   BUN 9 02/22/2017 1040   CREATININE 0.68 02/22/2017 1040   CALCIUM 8.8 02/22/2017 1040   GFRNONAA 120 02/22/2017 1040   GFRAA 139 02/22/2017 1040   Lab Results  Component Value Date   HGBA1C 5.2 02/22/2017   HGBA1C 5.4 07/03/2016   HGBA1C 5.1 10/27/2014   Lab Results  Component Value Date   INSULIN 17.7 02/22/2017   CBC    Component  Value Date/Time   WBC 4.0 02/22/2017 1040   WBC 6.8 12/02/2016 0358   RBC 3.88 02/22/2017 1040   RBC 3.73 (L) 12/02/2016 0358   HGB 10.8 (L) 02/22/2017 1040   HCT 32.6 (L) 02/22/2017 1040   PLT 413 (H) 12/02/2016 0358   MCV 84 02/22/2017 1040   MCH 27.8 02/22/2017 1040   MCH 26.3 12/02/2016 0358   MCHC 33.1 02/22/2017 1040   MCHC 31.0 12/02/2016 0358   RDW 15.7 (H) 02/22/2017 1040   LYMPHSABS 1.6 02/22/2017 1040   MONOABS 0.5 04/28/2014 1340   EOSABS 0.1 02/22/2017 1040   BASOSABS 0.0 02/22/2017 1040   Iron/TIBC/Ferritin/ %Sat    Component Value Date/Time   IRON 25 (L) 02/22/2017 1040   TIBC 344 02/22/2017 1040   FERRITIN 11 (L) 02/22/2017 1040   IRONPCTSAT 7 (LL) 02/22/2017 1040   Lipid Panel     Component Value Date/Time   CHOL 127 02/22/2017 1040   TRIG 80 02/22/2017 1040   HDL 34 (L) 02/22/2017 1040   CHOLHDL 4 10/27/2014 1712   VLDL 16.0 10/27/2014 1712   LDLCALC 77 02/22/2017 1040   Hepatic Function Panel     Component Value Date/Time   PROT 6.8 02/22/2017 1040   ALBUMIN 4.0 02/22/2017 1040   AST 15 02/22/2017 1040   ALT 14 02/22/2017 1040   ALKPHOS 92 02/22/2017 1040   BILITOT 0.3 02/22/2017 1040      Component Value Date/Time   TSH 2.150 02/22/2017 1040   TSH 2.62 07/03/2016 1404   TSH 2.88 11/04/2015 0856  Results for PRISTINE, GLADHILL (MRN [id number]) as of 05/16/2017 09:48  Ref. Range 02/22/2017 10:40  Vitamin D, 25-Hydroxy Latest Ref Range: 30.0 - 100.0 ng/mL 16.0 (L)    ASSESSMENT AND PLAN: Vitamin D deficiency - Plan: Vitamin D, Ergocalciferol, (DRISDOL) 50000 units CAPS capsule  Class 2 severe obesity with serious comorbidity and body mass index (BMI) of 36.0 to 36.9 in adult, unspecified obesity type (HCC)  PLAN:  Vitamin D Deficiency Traci Finley was informed that low vitamin D levels contributes to fatigue and are associated with obesity, breast, and colon cancer. Traci Finley agrees to continue taking prescription Vit D @50 ,000 IU every week #4 and  we will refill for 1 month. She will follow up for routine testing of vitamin D, at least 2-3 times per year. She was informed of the risk of over-replacement of vitamin D and agrees to not increase her dose unless he discusses this with [place] first. Traci Finley agrees to follow up with our clinic in 2 weeks and we will recheck labs at that time.  Obesity Traci Finley is currently in the action stage of change. As such, her goal is to continue with weight loss efforts She has agreed to follow the Category 2 plan Traci Finley has been instructed to work up to a goal of 150 minutes of combined cardio and strengthening exercise per week for weight loss and overall health benefits. We discussed the following Behavioral Modification Strategies today: work on meal planning and easy cooking plans, dealing with family or coworker sabotage, and keeping healthy foods in the home Traci Finley decided to stick with Category 2 plan.  Traci Finley has agreed to follow up with our clinic in 2 weeks. She was informed of the importance of frequent follow up visits to maximize her success with intensive lifestyle modifications for her multiple health conditions.  I, Burt Knack, am acting as transcriptionist for Quillian Quince, MD  I have reviewed the above documentation for accuracy and completeness, and I agree with the above. -Quillian Quince, MD     Today's visit was # 5 out of 22.  Starting weight: 226 lbs Starting date: 02/22/17 Today's weight : 224 lbs  Today's date: 05/16/2017 Total lbs lost to date: 2 (Patients must lose 7 lbs in the first 6 months to continue with counseling)   ASK: We discussed the diagnosis of obesity with Traci Finley today and Traci Finley agreed to give [place] permission to discuss obesity behavioral modification therapy today.  ASSESS: Traci Finley has the diagnosis of obesity and her BMI today is 36.17 Traci Finley is in the action stage of change   ADVISE: Traci Finley was educated on the multiple health risks of obesity  as well as the benefit of weight loss to improve her health. She was advised of the need for long term treatment and the importance of lifestyle modifications.  AGREE: Multiple dietary modification options and treatment options were discussed and  Traci Finley agreed to follow the Category 2 plan We discussed the following Behavioral Modification Strategies today: work on meal planning and easy cooking plans, dealing with family or coworker sabotage, and keeping healthy foods in the home

## 2017-05-22 ENCOUNTER — Ambulatory Visit (INDEPENDENT_AMBULATORY_CARE_PROVIDER_SITE_OTHER): Payer: BLUE CROSS/BLUE SHIELD | Admitting: Internal Medicine

## 2017-05-22 ENCOUNTER — Encounter: Payer: Self-pay | Admitting: Internal Medicine

## 2017-05-22 VITALS — BP 110/80 | HR 67 | Temp 97.5°F | Ht 66.0 in | Wt 230.0 lb

## 2017-05-22 DIAGNOSIS — Z0000 Encounter for general adult medical examination without abnormal findings: Secondary | ICD-10-CM | POA: Diagnosis not present

## 2017-05-22 NOTE — Patient Instructions (Signed)

## 2017-05-22 NOTE — Progress Notes (Signed)
   Subjective:    Patient ID: Traci Finley, female    DOB: 1991/02/18, 27 y.o.   MRN: [id number]  HPI The patient is a 27 YO female coming in for physical. No new concerns.  PMH, [facility], social history reviewed and updated.   Review of Systems  Constitutional: Negative.   HENT: Negative.   Eyes: Negative.   Respiratory: Negative for cough, chest tightness and shortness of breath.   Cardiovascular: Negative for chest pain, palpitations and leg swelling.  Gastrointestinal: Negative for abdominal distention, abdominal pain, constipation, diarrhea, nausea and vomiting.  Musculoskeletal: Negative.   Skin: Negative.   Neurological: Negative.   Psychiatric/Behavioral: Negative.       Objective:   Physical Exam  Constitutional: She is oriented to person, place, and time. She appears well-developed and well-nourished.  HENT:  Head: Normocephalic and atraumatic.  Eyes: EOM are normal.  Neck: Normal range of motion.  Cardiovascular: Normal rate and regular rhythm.  Pulmonary/Chest: Effort normal and breath sounds normal. No respiratory distress. She has no wheezes. She has no rales.  Abdominal: Soft. Bowel sounds are normal. She exhibits no distension. There is no tenderness. There is no rebound.  Musculoskeletal: She exhibits no edema.  Neurological: She is alert and oriented to person, place, and time. Coordination normal.  Skin: Skin is warm and dry.  Psychiatric: She has a normal mood and affect.   Vitals:   05/22/17 1339  BP: 110/80  Pulse: 67  Temp: (!) 97.5 F (36.4 C)  TempSrc: Oral  SpO2: 100%  Weight: 230 lb (104.3 kg)  Height: 5\' 6"  (1.676 m)      Assessment & Plan:

## 2017-05-23 NOTE — Assessment & Plan Note (Signed)
Reminded about need for pap smear. She declines flu shot. Tetanus up to date. Going to weight management and is stable to mildly down from last year. She is reminded about sun safety and mole surveillance as well as dangers of distracted driving. Given screening recommendations.

## 2017-06-05 ENCOUNTER — Ambulatory Visit (INDEPENDENT_AMBULATORY_CARE_PROVIDER_SITE_OTHER): Payer: BLUE CROSS/BLUE SHIELD | Admitting: Family Medicine

## 2017-06-05 VITALS — BP 104/69 | HR 80 | Temp 98.0°F | Ht 66.0 in | Wt 223.0 lb

## 2017-06-05 DIAGNOSIS — E559 Vitamin D deficiency, unspecified: Secondary | ICD-10-CM

## 2017-06-05 DIAGNOSIS — Z6836 Body mass index (BMI) 36.0-36.9, adult: Secondary | ICD-10-CM | POA: Diagnosis not present

## 2017-06-05 DIAGNOSIS — D508 Other iron deficiency anemias: Secondary | ICD-10-CM | POA: Diagnosis not present

## 2017-06-05 DIAGNOSIS — F3289 Other specified depressive episodes: Secondary | ICD-10-CM

## 2017-06-05 MED ORDER — BUPROPION HCL ER (SR) 150 MG PO TB12: 150.0000 mg | ORAL_TABLET | Freq: Every day | ORAL | 0 refills | Status: DC

## 2017-06-05 MED ORDER — VITAMIN D (ERGOCALCIFEROL) 1.25 MG (50000 UNIT) PO CAPS: 50000.0000 [IU] | ORAL_CAPSULE | ORAL | 0 refills | Status: DC

## 2017-06-05 MED ORDER — FERROUS SULFATE 325 (65 FE) MG PO TABS: 325.0000 mg | ORAL_TABLET | Freq: Two times a day (BID) | ORAL | 0 refills | Status: DC

## 2017-06-05 MED ORDER — DOCUSATE SODIUM 100 MG PO CAPS: 100.0000 mg | ORAL_CAPSULE | Freq: Two times a day (BID) | ORAL | 0 refills | Status: DC

## 2017-06-05 NOTE — Progress Notes (Signed)
Office: [phone]  /  Fax: [phone]   HPI:   Chief Complaint: OBESITY Traci Finley is here to discuss her progress with her obesity treatment plan. She is on the Category 2 plan and is following her eating plan approximately 50 % of the time. She states she is walking for 30 minutes 2-3 times per week. Traci Finley traveled home this past weekend and had a hard time with adhering to meal prep and follow her plan. She did grocery shopping and meal prep yesterday.  Her weight is 223 lb (101.2 kg) today and has had a weight loss of 1 pound over a period of 3 weeks since her last visit. She has lost 3 lbs since starting treatment with [place].  Vitamin D deficiency Traci Finley has a diagnosis of vitamin D deficiency. She is currently taking prescription Vit D. She notes fatigue and denies nausea, vomiting or muscle weakness.  Iron Deficiency Anemia Traci Finley has a diagnosis of iron deficiency anemia. She notes constipation with iron supplementation.   Depression with emotional eating behaviors Traci Finley's focus worsened in the past 2 months as well as difficulty sleeping. Traci Finley struggles with emotional eating and using food for comfort to the extent that it is negatively impacting her health. She often snacks when she is not hungry. Traci Finley sometimes feels she is out of control and then feels guilty that she made poor food choices. She has been working on behavior modification techniques to help reduce her emotional eating and has been somewhat successful. She shows no sign of suicidal or homicidal ideations.  Depression screen PHQ 2/9 02/22/2017  Decreased Interest 3  Down, Depressed, Hopeless 3  PHQ - 2 Score 6  Altered sleeping 3  Tired, decreased energy 3  Change in appetite 3  Feeling bad or failure about yourself  3  Trouble concentrating 3  Moving slowly or fidgety/restless 1  Suicidal thoughts 0  PHQ-9 Score 22  Difficult doing work/chores Somewhat difficult   ALLERGIES: No Known  Allergies  MEDICATIONS: Current Outpatient Medications on File Prior to Visit  Medication Sig Dispense Refill  . docusate sodium (COLACE) 100 MG capsule Take 1 capsule (100 mg total) 2 (two) times daily by mouth. 60 capsule 0  . ferrous sulfate 325 (65 FE) MG tablet Take 1 tablet (325 mg total) by mouth 2 (two) times daily with a meal. 60 tablet 0  . Vitamin D, Ergocalciferol, (DRISDOL) 50000 units CAPS capsule Take 1 capsule (50,000 Units total) by mouth every 7 (seven) days. 4 capsule 0   No current facility-administered medications on file prior to visit.     PAST MEDICAL HISTORY: Past Medical History:  Diagnosis Date  . Anemia   . Anxiety   . Asthma   . Back pain   . Chest pain   . Depression   . Frequent headaches   . Joint pain   . Persistent headaches   . Shortness of breath     PAST SURGICAL HISTORY: Past Surgical History:  Procedure Laterality Date  . CORNEAL TRANSPLANT    . WISDOM TOOTH EXTRACTION      SOCIAL HISTORY: Social History   Tobacco Use  . Smoking status: Former Smoker    Packs/day: 12.00    Years: 2.00    Pack years: 24.00    Last attempt to quit: 2015    Years since quitting: 4.0  . Smokeless tobacco: Never Used  Substance Use Topics  . Alcohol use: No  . Drug use: No    FAMILY  HISTORY: Family History  Problem Relation Age of Onset  . Arthritis Mother   . Hypertension Mother   . Cancer Mother        thyroid  . Thyroid disease Mother   . Obesity Mother   . Diabetes Maternal Uncle   . Arthritis Maternal Grandmother   . Hypertension Maternal Grandmother   . Heart disease Maternal Grandmother   . Diabetes Maternal Grandmother   . Cancer Maternal Grandmother        thyroid    ROS: Review of Systems  Constitutional: Positive for malaise/fatigue and weight loss.  Gastrointestinal: Positive for constipation. Negative for nausea and vomiting.  Musculoskeletal:       Negative muscle weakness  Psychiatric/Behavioral: Positive for  depression. Negative for suicidal ideas.    PHYSICAL EXAM: Blood pressure 104/69, pulse 80, temperature 98 F (36.7 C), temperature source Oral, height 5\' 6"  (1.676 m), weight 223 lb (101.2 kg), last menstrual period 05/10/2017, SpO2 99 %. Body mass index is 35.99 kg/m. Physical Exam  Constitutional: She is oriented to person, place, and time. She appears well-developed and well-nourished.  Cardiovascular: Normal rate.  Pulmonary/Chest: Effort normal.  Musculoskeletal: Normal range of motion.  Neurological: She is oriented to person, place, and time.  Skin: Skin is warm and dry.  Psychiatric: She has a normal mood and affect.  Vitals reviewed.   RECENT LABS AND TESTS: BMET    Component Value Date/Time   NA 142 02/22/2017 1040   K 4.7 02/22/2017 1040   CL 104 02/22/2017 1040   CO2 23 02/22/2017 1040   GLUCOSE 82 02/22/2017 1040   GLUCOSE 107 (H) 12/02/2016 0358   BUN 9 02/22/2017 1040   CREATININE 0.68 02/22/2017 1040   CALCIUM 8.8 02/22/2017 1040   GFRNONAA 120 02/22/2017 1040   GFRAA 139 02/22/2017 1040   Lab Results  Component Value Date   HGBA1C 5.2 02/22/2017   HGBA1C 5.4 07/03/2016   HGBA1C 5.1 10/27/2014   Lab Results  Component Value Date   INSULIN 17.7 02/22/2017   CBC    Component Value Date/Time   WBC 4.0 02/22/2017 1040   WBC 6.8 12/02/2016 0358   RBC 3.88 02/22/2017 1040   RBC 3.73 (L) 12/02/2016 0358   HGB 10.8 (L) 02/22/2017 1040   HCT 32.6 (L) 02/22/2017 1040   PLT 413 (H) 12/02/2016 0358   MCV 84 02/22/2017 1040   MCH 27.8 02/22/2017 1040   MCH 26.3 12/02/2016 0358   MCHC 33.1 02/22/2017 1040   MCHC 31.0 12/02/2016 0358   RDW 15.7 (H) 02/22/2017 1040   LYMPHSABS 1.6 02/22/2017 1040   MONOABS 0.5 04/28/2014 1340   EOSABS 0.1 02/22/2017 1040   BASOSABS 0.0 02/22/2017 1040   Iron/TIBC/Ferritin/ %Sat    Component Value Date/Time   IRON 25 (L) 02/22/2017 1040   TIBC 344 02/22/2017 1040   FERRITIN 11 (L) 02/22/2017 1040   IRONPCTSAT 7  (LL) 02/22/2017 1040   Lipid Panel     Component Value Date/Time   CHOL 127 02/22/2017 1040   TRIG 80 02/22/2017 1040   HDL 34 (L) 02/22/2017 1040   CHOLHDL 4 10/27/2014 1712   VLDL 16.0 10/27/2014 1712   LDLCALC 77 02/22/2017 1040   Hepatic Function Panel     Component Value Date/Time   PROT 6.8 02/22/2017 1040   ALBUMIN 4.0 02/22/2017 1040   AST 15 02/22/2017 1040   ALT 14 02/22/2017 1040   ALKPHOS 92 02/22/2017 1040   BILITOT 0.3 02/22/2017 1040  Component Value Date/Time   TSH 2.150 02/22/2017 1040   TSH 2.62 07/03/2016 1404   TSH 2.88 11/04/2015 0856  Results for YAHAYRA, GEIS (MRN [id number]) as of 06/05/2017 08:32  Ref. Range 02/22/2017 10:40  Vitamin D, 25-Hydroxy Latest Ref Range: 30.0 - 100.0 ng/mL 16.0 (L)    ASSESSMENT AND PLAN: Vitamin D deficiency - Plan: Vitamin D, Ergocalciferol, (DRISDOL) 50000 units CAPS capsule  Other iron deficiency anemia - Plan: ferrous sulfate 325 (65 FE) MG tablet, docusate sodium (COLACE) 100 MG capsule  Other depression - with emotional eating - Plan: buPROPion (WELLBUTRIN SR) 150 MG 12 hr tablet  Class 2 severe obesity with serious comorbidity and body mass index (BMI) of 36.0 to 36.9 in adult, unspecified obesity type (HCC)  PLAN:  Vitamin D Deficiency Traci Finley was informed that low vitamin D levels contributes to fatigue and are associated with obesity, breast, and colon cancer. Traci Finley agrees to continue taking prescription Vit D @50 ,000 IU every week #4 and weill refill for 1 month. She will follow up for routine testing of vitamin D, at least 2-3 times per year. She was informed of the risk of over-replacement of vitamin D and agrees to not increase her dose unless she discusses this with [place] first. Traci Finley agrees to follow up with our clinic in 2 weeks.  Iron Deficiency Anemia The diagnosis of Iron deficiency anemia was discussed with Traci Finley and was explained in detail. She was given suggestions of iron rich foods. Traci Finley  agrees to continue Colace 100 mg BID and we will refill for 1 month and she agrees to continue ferrous sulfate 325 (65 FE) mg BID and we will refill for 1 month. Traci Finley agrees to follow up with our clinic in 2 weeks.  Depression with Emotional Eating Behaviors We discussed behavior modification techniques today to help Traci Finley deal with her emotional eating and depression. Traci Finley agrees to start Wellbutrin SR 150 mg qd #30 with no refills. Traci Finley agrees to follow up with our clinic in 2 weeks.  Obesity Traci Finley is currently in the action stage of change. As such, her goal is to continue with weight loss efforts She has agreed to follow the Category 2 plan Traci Finley has been instructed to work up to a goal of 150 minutes of combined cardio and strengthening exercise per week for weight loss and overall health benefits. We discussed the following Behavioral Modification Strategies today: work on meal planning and easy cooking plans and keeping healthy foods in the home   Traci Finley has agreed to follow up with our clinic in 2 weeks. She was informed of the importance of frequent follow up visits to maximize her success with intensive lifestyle modifications for her multiple health conditions.   OBESITY BEHAVIORAL INTERVENTION VISIT  Today's visit was # 6 out of 22.  Starting weight: 226 lbs Starting date: 02/22/17 Today's weight : 223 lbs  Today's date: 06/05/2017 Total lbs lost to date: 3 (Patients must lose 7 lbs in the first 6 months to continue with counseling)   ASK: We discussed the diagnosis of obesity with Traci Finley today and Traci Finley agreed to give [place] permission to discuss obesity behavioral modification therapy today.  ASSESS: Traci Finley has the diagnosis of obesity and her BMI today is 36.01 Traci Finley is in the action stage of change   ADVISE: Traci Finley was educated on the multiple health risks of obesity as well as the benefit of weight loss to improve her health. She was advised of the  need for long  term treatment and the importance of lifestyle modifications.  AGREE: Multiple dietary modification options and treatment options were discussed and  Traci Sheldonshley agreed to the above obesity treatment plan.  I, Traci KnackSharon Martin, am acting as transcriptionist for Quillian Quincearen Beasley, MD  I have reviewed the above documentation for accuracy and completeness, and I agree with the above. -Quillian Quincearen Beasley, MD

## 2017-06-20 ENCOUNTER — Ambulatory Visit (INDEPENDENT_AMBULATORY_CARE_PROVIDER_SITE_OTHER): Payer: BLUE CROSS/BLUE SHIELD | Admitting: Family Medicine

## 2017-06-20 VITALS — BP 101/67 | HR 74 | Temp 97.5°F | Ht 66.0 in | Wt 222.0 lb

## 2017-06-20 DIAGNOSIS — E559 Vitamin D deficiency, unspecified: Secondary | ICD-10-CM | POA: Diagnosis not present

## 2017-06-20 DIAGNOSIS — D508 Other iron deficiency anemias: Secondary | ICD-10-CM

## 2017-06-20 DIAGNOSIS — Z9189 Other specified personal risk factors, not elsewhere classified: Secondary | ICD-10-CM

## 2017-06-20 DIAGNOSIS — E66812 Obesity, class 2: Secondary | ICD-10-CM

## 2017-06-20 DIAGNOSIS — E88819 Insulin resistance, unspecified: Secondary | ICD-10-CM

## 2017-06-20 DIAGNOSIS — E8881 Metabolic syndrome: Secondary | ICD-10-CM

## 2017-06-20 DIAGNOSIS — F3289 Other specified depressive episodes: Secondary | ICD-10-CM | POA: Diagnosis not present

## 2017-06-20 DIAGNOSIS — Z6835 Body mass index (BMI) 35.0-35.9, adult: Secondary | ICD-10-CM | POA: Diagnosis not present

## 2017-06-20 MED ORDER — BUPROPION HCL ER (SR) 200 MG PO TB12: 200.0000 mg | ORAL_TABLET | Freq: Every day | ORAL | 0 refills | Status: DC

## 2017-06-20 NOTE — Progress Notes (Signed)
Office: [phone]  /  Fax: [phone]   HPI:   Chief Complaint: OBESITY Traci Finley is here to discuss her progress with her obesity treatment plan. She is on the Category 2 plan and is following her eating plan approximately 65 % of the time. She states she is walking 30 minutes 4 times per week. Traci Finley continues to lose weight, but she has had some stress eating, and was missing some meals. Her weight is 222 lb (100.7 kg) today and has had a weight loss of 1 pound over a period of 2 weeks since her last visit. She has lost 4 lbs since starting treatment with us.  Vitamin D deficiency Traci Finley has a diagnosis of vitamin D deficiency. She is on prescription vit D and last level was not at goal. Traci Finley denies nausea, vomiting or muscle weakness. Traci Finley is due for labs.   Ref. Range 02/22/2017 10:40  Vitamin D, 25-Hydroxy Latest Ref Range: 30.0 - 100.0 ng/mL 16.0 (L)   Insulin Resistance Traci Finley has a diagnosis of insulin resistance. Her Hgb A1c is normal, but her fasting insulin level is elevated at 17.7 and she shows signs of PCOS, such as irregular menses. Although Lashawnta's blood glucose readings are still under good control, insulin resistance puts her at greater risk of metabolic syndrome and diabetes. She is not taking metformin currently and continues to work on diet and exercise to decrease risk of diabetes.  At risk for diabetes Traci Finley is at higher than average risk for developing diabetes due to her obesity and insulin resistance. She currently denies polyuria or polydipsia.  Iron Deficiency Anemia Traci Finley has a diagnosis of anemia.  She  has heavy irregular menses and is on ferrous sulfate. She is due for labs to be rechecked.  Depression with emotional eating behaviors Traci Finley is on wellbutrin, but still struggles with emotional eating. She is not sure if the medicine is helping much. She has had increased stress at home recently. Traci Finley struggles with emotional eating and using  food for comfort to the extent that it is negatively impacting her health. She often snacks when she is not hungry. Traci Finley sometimes feels she is out of control and then feels guilty that she made poor food choices. She has been working on behavior modification techniques to help reduce her emotional eating and has been somewhat successful. She shows no sign of suicidal or homicidal ideations.  Depression screen PHQ 2/9 02/22/2017  Decreased Interest 3  Down, Depressed, Hopeless 3  PHQ - 2 Score 6  Altered sleeping 3  Tired, decreased energy 3  Change in appetite 3  Feeling bad or failure about yourself  3  Trouble concentrating 3  Moving slowly or fidgety/restless 1  Suicidal thoughts 0  PHQ-9 Score 22  Difficult doing work/chores Somewhat difficult      ALLERGIES: No Known Allergies  MEDICATIONS: Current Outpatient Medications on File Prior to Visit  Medication Sig Dispense Refill   docusate sodium (COLACE) 100 MG capsule Take 1 capsule (100 mg total) by mouth 2 (two) times daily. 60 capsule 0   ferrous sulfate 325 (65 FE) MG tablet Take 1 tablet (325 mg total) by mouth 2 (two) times daily with a meal. 60 tablet 0   Vitamin D, Ergocalciferol, (DRISDOL) 50000 units CAPS capsule Take 1 capsule (50,000 Units total) by mouth every 7 (seven) days. 4 capsule 0   No current facility-administered medications on file prior to visit.     PAST MEDICAL HISTORY: Past Medical History:  Diagnosis Date   Anemia    Anxiety    Asthma    Back pain    Chest pain    Depression    Frequent headaches    Joint pain    Persistent headaches    Shortness of breath     PAST SURGICAL HISTORY: Past Surgical History:  Procedure Laterality Date   CORNEAL TRANSPLANT     WISDOM TOOTH EXTRACTION      SOCIAL HISTORY: Social History   Tobacco Use   Smoking status: Former Smoker    Packs/day: 12.00    Years: 2.00    Pack years: 24.00    Last attempt to quit: 2015    Years  since quitting: 4.1   Smokeless tobacco: Never Used  Substance Use Topics   Alcohol use: No   Drug use: No    FAMILY HISTORY: Family History  Problem Relation Age of Onset   Arthritis Mother    Hypertension Mother    Cancer Mother        thyroid   Thyroid disease Mother    Obesity Mother    Diabetes Maternal Uncle    Arthritis Maternal Grandmother    Hypertension Maternal Grandmother    Heart disease Maternal Grandmother    Diabetes Maternal Grandmother    Cancer Maternal Grandmother        thyroid    ROS: Review of Systems  Constitutional: Positive for weight loss.  Gastrointestinal: Negative for nausea and vomiting.  Genitourinary: Negative for frequency.  Musculoskeletal:       Negative muscle weakness  Endo/Heme/Allergies: Negative for polydipsia.  Psychiatric/Behavioral: Positive for depression. Negative for suicidal ideas.       Positive stress    PHYSICAL EXAM: Blood pressure 101/67, pulse 74, temperature (!) 97.5 F (36.4 C), temperature source Oral, height 5\' 6"  (1.676 m), weight 222 lb (100.7 kg), last menstrual period 06/05/2017, SpO2 98 %. Body mass index is 35.83 kg/m. Physical Exam  Constitutional: She is oriented to person, place, and time. She appears well-developed and well-nourished.  Cardiovascular: Normal rate.  Pulmonary/Chest: Effort normal.  Musculoskeletal: Normal range of motion.  Neurological: She is oriented to person, place, and time.  Skin: Skin is warm and dry.  Vitals reviewed.   RECENT LABS AND TESTS: BMET    Component Value Date/Time   NA 142 02/22/2017 1040   K 4.7 02/22/2017 1040   CL 104 02/22/2017 1040   CO2 23 02/22/2017 1040   GLUCOSE 82 02/22/2017 1040   GLUCOSE 107 (H) 12/02/2016 0358   BUN 9 02/22/2017 1040   CREATININE 0.68 02/22/2017 1040   CALCIUM 8.8 02/22/2017 1040   GFRNONAA 120 02/22/2017 1040   GFRAA 139 02/22/2017 1040   Lab Results  Component Value Date   HGBA1C 5.2 02/22/2017    HGBA1C 5.4 07/03/2016   HGBA1C 5.1 10/27/2014   Lab Results  Component Value Date   INSULIN 17.7 02/22/2017   CBC    Component Value Date/Time   WBC 4.0 02/22/2017 1040   WBC 6.8 12/02/2016 0358   RBC 3.88 02/22/2017 1040   RBC 3.73 (L) 12/02/2016 0358   HGB 10.8 (L) 02/22/2017 1040   HCT 32.6 (L) 02/22/2017 1040   PLT 413 (H) 12/02/2016 0358   MCV 84 02/22/2017 1040   MCH 27.8 02/22/2017 1040   MCH 26.3 12/02/2016 0358   MCHC 33.1 02/22/2017 1040   MCHC 31.0 12/02/2016 0358   RDW 15.7 (H) 02/22/2017 1040   LYMPHSABS 1.6 02/22/2017 1040  MONOABS 0.5 04/28/2014 1340   EOSABS 0.1 02/22/2017 1040   BASOSABS 0.0 02/22/2017 1040   Iron/TIBC/Ferritin/ %Sat    Component Value Date/Time   IRON 25 (L) 02/22/2017 1040   TIBC 344 02/22/2017 1040   FERRITIN 11 (L) 02/22/2017 1040   IRONPCTSAT 7 (LL) 02/22/2017 1040   Lipid Panel     Component Value Date/Time   CHOL 127 02/22/2017 1040   TRIG 80 02/22/2017 1040   HDL 34 (L) 02/22/2017 1040   CHOLHDL 4 10/27/2014 1712   VLDL 16.0 10/27/2014 1712   LDLCALC 77 02/22/2017 1040   Hepatic Function Panel     Component Value Date/Time   PROT 6.8 02/22/2017 1040   ALBUMIN 4.0 02/22/2017 1040   AST 15 02/22/2017 1040   ALT 14 02/22/2017 1040   ALKPHOS 92 02/22/2017 1040   BILITOT 0.3 02/22/2017 1040      Component Value Date/Time   TSH 2.150 02/22/2017 1040   TSH 2.62 07/03/2016 1404   TSH 2.88 11/04/2015 0856     Ref. Range 02/22/2017 10:40  Vitamin D, 25-Hydroxy Latest Ref Range: 30.0 - 100.0 ng/mL 16.0 (L)   ASSESSMENT AND PLAN: Vitamin D deficiency - Plan: VITAMIN D 25 Hydroxy (Vit-D Deficiency, Fractures)  Insulin resistance - Plan: Comprehensive metabolic panel, Hemoglobin A1c, Insulin, random  Other iron deficiency anemia - Plan: CBC With Differential, Anemia panel  Other depression - with emotional eating - Plan: buPROPion (WELLBUTRIN SR) 200 MG 12 hr tablet  At risk for diabetes mellitus  Class 2 severe  obesity with serious comorbidity and body mass index (BMI) of 35.0 to 35.9 in adult, unspecified obesity type (HCC)  PLAN:  Vitamin D Deficiency Almadelia was informed that low vitamin D levels contributes to fatigue and are associated with obesity, breast, and colon cancer. She agrees to continue to take prescription Vit D @50 ,000 IU every week. We will check labs and will follow up for routine testing of vitamin D, at least 2-3 times per year. She was informed of the risk of over-replacement of vitamin D and agrees to not increase her dose unless she discusses this with [place] first. Nikiesha agrees to follow up with our clinic in 2 weeks.  Insulin Resistance Mycala will continue to work on weight loss, exercise, and decreasing simple carbohydrates in her diet to help decrease the risk of diabetes. She was informed that eating too many simple carbohydrates or too many calories at one sitting increases the likelihood of GI side effects. We will check labs and may consider metformin in the near future. Rihanna agreed to follow up with [place] as directed to monitor her progress.  Diabetes risk counseling Bathsheba was given extended (15 minutes) diabetes prevention counseling today. She is 27 y.o. female and has risk factors for diabetes including obesity and insulin resistance. We discussed intensive lifestyle modifications today with an emphasis on weight loss as well as increasing exercise and decreasing simple carbohydrates in her diet.  Iron Deficiency Anemia The diagnosis of Iron deficiency anemia was discussed with Traci Sheldon and was explained in detail. She was given suggestions of iron rich foods and iron supplement  prescribed. We will check labs and Aubrianna agreed to follow up with our clinic in 2 weeks.  Depression with Emotional Eating Behaviors We discussed behavior modification techniques today to help Emmakate deal with her emotional eating and depression. She has agreed to increase Wellbutrin SR to 200 mg  qam #30 with no refills and follow up with our clinic in  2 weeks.  Obesity Mulan is currently in the action stage of change. As such, her goal is to continue with weight loss efforts She has agreed to follow the Category 2 plan Lisbet has been instructed to work up to a goal of 150 minutes of combined cardio and strengthening exercise per week for weight loss and overall health benefits. We discussed the following Behavioral Modification Strategies today: emotional eating strategies, better snacking choices and no skipping meals  Acadia has agreed to follow up with our clinic in 2 weeks. She was informed of the importance of frequent follow up visits to maximize her success with intensive lifestyle modifications for her multiple health conditions.   OBESITY BEHAVIORAL INTERVENTION VISIT  Today's visit was # 7 out of 22.  Starting weight: 226 lbs Starting date: 02/22/17 Today's weight : 222 lbs Today's date: 06/20/2017 Total lbs lost to date: 4 (Patients must lose 7 lbs in the first 6 months to continue with counseling)   ASK: We discussed the diagnosis of obesity with Dara Lords today and Maekayla agreed to give [place] permission to discuss obesity behavioral modification therapy today.  ASSESS: Kayani has the diagnosis of obesity and her BMI today is 35.85 Milena is in the action stage of change   ADVISE: Seila was educated on the multiple health risks of obesity as well as the benefit of weight loss to improve her health. She was advised of the need for long term treatment and the importance of lifestyle modifications.  AGREE: Multiple dietary modification options and treatment options were discussed and  Vena agreed to the above obesity treatment plan.  I, [place] Crane, am acting as transcriptionist for Quillian Quince, MD I have reviewed the above documentation for accuracy and completeness, and I agree with the above. -Quillian Quince, MD

## 2017-06-21 LAB — COMPREHENSIVE METABOLIC PANEL
ALT: 12 IU/L (ref 0–32)
AST: 14 IU/L (ref 0–40)
Albumin/Globulin Ratio: 1.4 (ref 1.2–2.2)
Albumin: 4.1 g/dL (ref 3.5–5.5)
Alkaline Phosphatase: 93 IU/L (ref 39–117)
BUN/Creatinine Ratio: 14 (ref 9–23)
BUN: 10 mg/dL (ref 6–20)
Bilirubin Total: 0.2 mg/dL (ref 0.0–1.2)
CO2: 23 mmol/L (ref 20–29)
Calcium: 9.2 mg/dL (ref 8.7–10.2)
Chloride: 102 mmol/L (ref 96–106)
Creatinine, Ser: 0.69 mg/dL (ref 0.57–1.00)
GFR calc Af Amer: 137 mL/min/{1.73_m2} (ref >59)
GFR calc non Af Amer: 119 mL/min/{1.73_m2} (ref >59)
Globulin, Total: 2.9 g/dL (ref 1.5–4.5)
Glucose: 91 mg/dL (ref 65–99)
Potassium: 4.7 mmol/L (ref 3.5–5.2)
Sodium: 140 mmol/L (ref 134–144)
Total Protein: 7 g/dL (ref 6.0–8.5)

## 2017-06-21 LAB — CBC WITH DIFFERENTIAL
Basophils Absolute: 0 10*3/uL (ref 0.0–0.2)
Basos: 0 %
EOS (ABSOLUTE): 0.1 10*3/uL (ref 0.0–0.4)
Eos: 2 %
Hemoglobin: 11.6 g/dL (ref 11.1–15.9)
Immature Grans (Abs): 0 10*3/uL (ref 0.0–0.1)
Immature Granulocytes: 0 %
Lymphocytes Absolute: 1.8 10*3/uL (ref 0.7–3.1)
Lymphs: 34 %
MCH: 29.6 pg (ref 26.6–33.0)
MCHC: 33.4 g/dL (ref 31.5–35.7)
MCV: 89 fL (ref 79–97)
Monocytes Absolute: 0.4 10*3/uL (ref 0.1–0.9)
Monocytes: 9 %
Neutrophils Absolute: 2.8 10*3/uL (ref 1.4–7.0)
Neutrophils: 55 %
RBC: 3.92 x10E6/uL (ref 3.77–5.28)
RDW: 13.6 % (ref 12.3–15.4)
WBC: 5.2 10*3/uL (ref 3.4–10.8)

## 2017-06-21 LAB — ANEMIA PANEL
Ferritin: 14 ng/mL — ABNORMAL LOW (ref 15–150)
Folate, Hemolysate: 382.8 ng/mL
Folate, RBC: 1103 ng/mL (ref >498)
Hematocrit: 34.7 % (ref 34.0–46.6)
Iron Saturation: 7 % — CL (ref 15–55)
Iron: 24 ug/dL — ABNORMAL LOW (ref 27–159)
Retic Ct Pct: 2.2 % (ref 0.6–2.6)
Total Iron Binding Capacity: 336 ug/dL (ref 250–450)
UIBC: 312 ug/dL (ref 131–425)
Vitamin B-12: 522 pg/mL (ref 232–1245)

## 2017-06-21 LAB — INSULIN, RANDOM: INSULIN: 38.7 u[IU]/mL — ABNORMAL HIGH (ref 2.6–24.9)

## 2017-06-21 LAB — VITAMIN D 25 HYDROXY (VIT D DEFICIENCY, FRACTURES): Vit D, 25-Hydroxy: 22.1 ng/mL — ABNORMAL LOW (ref 30.0–100.0)

## 2017-06-21 LAB — HEMOGLOBIN A1C
Est. average glucose Bld gHb Est-mCnc: 100 mg/dL
Hgb A1c MFr Bld: 5.1 % (ref 4.8–5.6)

## 2017-06-29 ENCOUNTER — Ambulatory Visit (INDEPENDENT_AMBULATORY_CARE_PROVIDER_SITE_OTHER): Payer: BLUE CROSS/BLUE SHIELD | Admitting: Family Medicine

## 2017-06-29 ENCOUNTER — Encounter: Payer: Self-pay | Admitting: Family Medicine

## 2017-06-29 VITALS — BP 116/72 | HR 83 | Temp 99.5°F | Ht 66.0 in | Wt 228.2 lb

## 2017-06-29 DIAGNOSIS — J069 Acute upper respiratory infection, unspecified: Secondary | ICD-10-CM | POA: Diagnosis not present

## 2017-06-29 NOTE — Patient Instructions (Signed)
Please try things such as zyrtec-D or allegra-D which is an antihistamine and decongestant.   Please try afrin which will help with nasal congestion but use for only three days.   Please also try using a netti pot on a regular occasion.  Honey can help with a sore throat.   Vick's and Delsym can help with a cough.  

## 2017-06-29 NOTE — Progress Notes (Signed)
Traci Finley [place] - 27 y.o. female MRN [id number]  Date of birth: [birth year]  SUBJECTIVE:  Including CC & ROS.  Chief Complaint  Patient presents with  . Nasal Congestion    Scratchy throat, runny nose, diarrhea, fever this morning; body aches, frequent urnination    Traci Finley Direnzo is a 27 y.o. female that is presenting with scratchy throat, nasal congestion, and body aches. Her symptoms started yesterday. She felt warm this morning but did not take her temperature. She has been exposed to other people with similar symptoms at work. She denies any sputum production. Feels that her symptoms are staying the same.   Review of Systems  Constitutional: Negative for fever.  HENT: Positive for rhinorrhea, sinus pain and sore throat.   Respiratory: Negative for cough.   Cardiovascular: Negative for chest pain.  Gastrointestinal: Negative for abdominal pain.    HISTORY: Past Medical, Surgical, Social, and Family History Reviewed & Updated per EMR.   Pertinent Historical Findings include:  Past Medical History:  Diagnosis Date  . Anemia   . Anxiety   . Asthma   . Back pain   . Chest pain   . Depression   . Frequent headaches   . Joint pain   . Persistent headaches   . Shortness of breath     Past Surgical History:  Procedure Laterality Date  . CORNEAL TRANSPLANT    . WISDOM TOOTH EXTRACTION      No Known Allergies  Family History  Problem Relation Age of Onset  . Arthritis Mother   . Hypertension Mother   . Cancer Mother        thyroid  . Thyroid disease Mother   . Obesity Mother   . Diabetes Maternal Uncle   . Arthritis Maternal Grandmother   . Hypertension Maternal Grandmother   . Heart disease Maternal Grandmother   . Diabetes Maternal Grandmother   . Cancer Maternal Grandmother        thyroid     Social History   Socioeconomic History  . Marital status: Single    Spouse name: Not on file  . Number of children: 0  . Years of education: Not on file  .  Highest education level: Not on file  Social Needs  . Financial resource strain: Not on file  . Food insecurity - worry: Not on file  . Food insecurity - inability: Not on file  . Transportation needs - medical: Not on file  . Transportation needs - non-medical: Not on file  Occupational History  . Occupation: ArboriculturistLoan Operation Specialist III    Employer: [employer]  Tobacco Use  . Smoking status: Former Smoker    Packs/day: 12.00    Years: 2.00    Pack years: 24.00    Last attempt to quit: 2015    Years since quitting: 4.1  . Smokeless tobacco: Never Used  Substance and Sexual Activity  . Alcohol use: No  . Drug use: No  . Sexual activity: Not on file  Other Topics Concern  . Not on file  Social History Narrative   Lives with girlfriend      PHYSICAL EXAM:  VS: BP 116/72 (BP Location: Left Arm, Patient Position: Sitting, Cuff Size: Large)   Pulse 83   Temp 99.5 F (37.5 C) (Oral)   Ht 5\' 6"  (1.676 m)   Wt 228 lb 2.6 oz (103.5 kg)   LMP 06/05/2017 (Exact Date)   SpO2 99%   BMI 36.83 kg/m  Physical Exam Gen:  NAD, alert, cooperative with exam, well-appearing ENT: normal lips, normal nasal mucosa, tympanic membranes clear and intact bilaterally, normal oropharynx, no cervical lymphadenopathy Eye: normal EOM, normal conjunctiva and lids CV:  no edema, +2 pedal pulses, regular rate and rhythm, S1-S2   Resp: no accessory muscle use, non-labored, clear to auscultation bilaterally, no crackles or wheezes  Skin: no rashes, no areas of induration  Neuro: normal tone, normal sensation to touch Psych:  normal insight, alert and oriented MSK: Normal gait, normal strength       ASSESSMENT & PLAN:   Upper respiratory tract infection Symptoms likely viral in nature -Counseled on supportive care - Given indications for follow-up.

## 2017-06-29 NOTE — Assessment & Plan Note (Signed)
Symptoms likely viral in nature -Counseled on supportive care - Given indications for follow-up.

## 2017-07-02 ENCOUNTER — Other Ambulatory Visit (INDEPENDENT_AMBULATORY_CARE_PROVIDER_SITE_OTHER): Payer: Self-pay | Admitting: Family Medicine

## 2017-07-02 DIAGNOSIS — F3289 Other specified depressive episodes: Secondary | ICD-10-CM

## 2017-07-02 DIAGNOSIS — D508 Other iron deficiency anemias: Secondary | ICD-10-CM

## 2017-07-04 ENCOUNTER — Encounter (INDEPENDENT_AMBULATORY_CARE_PROVIDER_SITE_OTHER): Payer: Self-pay

## 2017-07-04 ENCOUNTER — Ambulatory Visit (INDEPENDENT_AMBULATORY_CARE_PROVIDER_SITE_OTHER): Payer: BLUE CROSS/BLUE SHIELD | Admitting: Family Medicine

## 2017-07-17 ENCOUNTER — Other Ambulatory Visit (INDEPENDENT_AMBULATORY_CARE_PROVIDER_SITE_OTHER): Payer: Self-pay | Admitting: Family Medicine

## 2017-07-17 DIAGNOSIS — F3289 Other specified depressive episodes: Secondary | ICD-10-CM

## 2017-07-25 DIAGNOSIS — H18603 Keratoconus, unspecified, bilateral: Secondary | ICD-10-CM | POA: Diagnosis not present

## 2017-07-25 DIAGNOSIS — Z947 Corneal transplant status: Secondary | ICD-10-CM | POA: Diagnosis not present

## 2017-10-12 DIAGNOSIS — J3089 Other allergic rhinitis: Secondary | ICD-10-CM | POA: Diagnosis not present

## 2017-10-23 DIAGNOSIS — N898 Other specified noninflammatory disorders of vagina: Secondary | ICD-10-CM | POA: Diagnosis not present

## 2017-10-23 DIAGNOSIS — Z1389 Encounter for screening for other disorder: Secondary | ICD-10-CM | POA: Diagnosis not present

## 2017-10-23 DIAGNOSIS — Z124 Encounter for screening for malignant neoplasm of cervix: Secondary | ICD-10-CM | POA: Diagnosis not present

## 2017-10-23 DIAGNOSIS — Z130 Encounter for screening for diseases of the blood and blood-forming organs and certain disorders involving the immune mechanism: Secondary | ICD-10-CM | POA: Diagnosis not present

## 2017-10-23 DIAGNOSIS — Z6838 Body mass index (BMI) 38.0-38.9, adult: Secondary | ICD-10-CM | POA: Diagnosis not present

## 2017-10-23 DIAGNOSIS — Z01419 Encounter for gynecological examination (general) (routine) without abnormal findings: Secondary | ICD-10-CM | POA: Diagnosis not present

## 2017-10-23 DIAGNOSIS — Z113 Encounter for screening for infections with a predominantly sexual mode of transmission: Secondary | ICD-10-CM | POA: Diagnosis not present

## 2017-10-24 ENCOUNTER — Encounter: Payer: Self-pay | Admitting: Internal Medicine

## 2017-10-24 DIAGNOSIS — Z113 Encounter for screening for infections with a predominantly sexual mode of transmission: Secondary | ICD-10-CM | POA: Diagnosis not present

## 2017-10-24 DIAGNOSIS — Z124 Encounter for screening for malignant neoplasm of cervix: Secondary | ICD-10-CM | POA: Diagnosis not present

## 2017-10-24 DIAGNOSIS — Z1151 Encounter for screening for human papillomavirus (HPV): Secondary | ICD-10-CM | POA: Diagnosis not present

## 2017-10-24 LAB — HM PAP SMEAR

## 2017-10-24 NOTE — Progress Notes (Signed)
Abstracted and sent to scan  

## 2018-02-22 DIAGNOSIS — H18603 Keratoconus, unspecified, bilateral: Secondary | ICD-10-CM | POA: Diagnosis not present

## 2018-02-22 DIAGNOSIS — Z947 Corneal transplant status: Secondary | ICD-10-CM | POA: Diagnosis not present

## 2018-03-01 DIAGNOSIS — H18603 Keratoconus, unspecified, bilateral: Secondary | ICD-10-CM | POA: Diagnosis not present

## 2018-03-01 DIAGNOSIS — Z947 Corneal transplant status: Secondary | ICD-10-CM | POA: Diagnosis not present

## 2018-04-05 DIAGNOSIS — R5383 Other fatigue: Secondary | ICD-10-CM | POA: Diagnosis not present

## 2018-04-05 DIAGNOSIS — R7303 Prediabetes: Secondary | ICD-10-CM | POA: Diagnosis not present

## 2018-06-12 DIAGNOSIS — Z0000 Encounter for general adult medical examination without abnormal findings: Secondary | ICD-10-CM | POA: Diagnosis not present

## 2018-07-22 ENCOUNTER — Ambulatory Visit (INDEPENDENT_AMBULATORY_CARE_PROVIDER_SITE_OTHER): Payer: BLUE CROSS/BLUE SHIELD | Admitting: Internal Medicine

## 2018-07-22 ENCOUNTER — Encounter: Payer: Self-pay | Admitting: Internal Medicine

## 2018-07-22 ENCOUNTER — Other Ambulatory Visit (INDEPENDENT_AMBULATORY_CARE_PROVIDER_SITE_OTHER): Payer: BLUE CROSS/BLUE SHIELD

## 2018-07-22 VITALS — BP 118/78 | HR 65 | Temp 98.7°F | Ht 66.0 in | Wt 212.0 lb

## 2018-07-22 DIAGNOSIS — G8929 Other chronic pain: Secondary | ICD-10-CM | POA: Diagnosis not present

## 2018-07-22 DIAGNOSIS — M545 Low back pain, unspecified: Secondary | ICD-10-CM

## 2018-07-22 DIAGNOSIS — R109 Unspecified abdominal pain: Secondary | ICD-10-CM

## 2018-07-22 LAB — COMPREHENSIVE METABOLIC PANEL
ALT: 22 U/L (ref 0–35)
AST: 19 U/L (ref 0–37)
Albumin: 4 g/dL (ref 3.5–5.2)
Alkaline Phosphatase: 75 U/L (ref 39–117)
BUN: 13 mg/dL (ref 6–23)
CO2: 27 mEq/L (ref 19–32)
Calcium: 9 mg/dL (ref 8.4–10.5)
Chloride: 105 mEq/L (ref 96–112)
Creatinine, Ser: 0.66 mg/dL (ref 0.40–1.20)
GFR: 127.78 mL/min (ref >60.00)
Glucose, Bld: 86 mg/dL (ref 70–99)
Potassium: 4.3 mEq/L (ref 3.5–5.1)
Sodium: 138 mEq/L (ref 135–145)
Total Bilirubin: 0.4 mg/dL (ref 0.2–1.2)
Total Protein: 7.2 g/dL (ref 6.0–8.3)

## 2018-07-22 LAB — CBC
HCT: 33.3 % — ABNORMAL LOW (ref 36.0–46.0)
Hemoglobin: 10.8 g/dL — ABNORMAL LOW (ref 12.0–15.0)
MCHC: 32.5 g/dL (ref 30.0–36.0)
MCV: 82.6 fl (ref 78.0–100.0)
Platelets: 411 10*3/uL — ABNORMAL HIGH (ref 150.0–400.0)
RBC: 4.03 Mil/uL (ref 3.87–5.11)
RDW: 15.5 % (ref 11.5–15.5)
WBC: 5.5 10*3/uL (ref 4.0–10.5)

## 2018-07-22 LAB — LIPASE: Lipase: 18 U/L (ref 11.0–59.0)

## 2018-07-22 NOTE — Patient Instructions (Signed)
We will check the labs today and the ultrasounds.

## 2018-07-22 NOTE — Progress Notes (Signed)
   Subjective:   Patient ID: Traci Finley, female    DOB: 09/27/1990, 28 y.o.   MRN: [id number]  HPI The patient is a 28 YO female coming in for back pain which is intermittent (she is having off and on back pain in the lower back with sleeping, sometimes will wake her up, usually is worse with a lot of activity during the day, has been working out the last 2 months which has not caused flare, intentionally down almost 20 pounds, denies injury or overuse) and stomach pain (episode in the last 2 days which was 10/10 pain, low stomach, right before period was to start, usually gets cramps with cycle but this was not the same, denies diarrhea or constipation, denies urinary symptoms, no new sexual partners, no chance of pregnancy, currently on cycle, denies worsening pain with food, this lasted about 20 minutes, has not happened like this before).   Review of Systems  Constitutional: Positive for activity change.  HENT: Negative.   Eyes: Negative.   Respiratory: Negative for cough, chest tightness and shortness of breath.   Cardiovascular: Negative for chest pain, palpitations and leg swelling.  Gastrointestinal: Positive for abdominal pain. Negative for abdominal distention, anal bleeding, blood in stool, constipation, diarrhea, nausea, rectal pain and vomiting.  Musculoskeletal: Positive for arthralgias, back pain and myalgias.  Skin: Negative.   Neurological: Negative.   Psychiatric/Behavioral: Negative.     Objective:  Physical Exam Constitutional:      Appearance: She is well-developed.  HENT:     Head: Normocephalic and atraumatic.  Neck:     Musculoskeletal: Normal range of motion.  Cardiovascular:     Rate and Rhythm: Normal rate and regular rhythm.  Pulmonary:     Effort: Pulmonary effort is normal. No respiratory distress.     Breath sounds: Normal breath sounds. No wheezing or rales.  Abdominal:     General: Bowel sounds are normal. There is no distension.     Palpations:  Abdomen is soft.     Tenderness: There is abdominal tenderness. There is no rebound.     Comments: Some pain in the low stomach with position and bending down, no guarding or rebound and BS normal  Musculoskeletal:        General: Tenderness present.     Comments: Midline and paraspinal lumbar region tender to touch  Skin:    General: Skin is warm and dry.  Neurological:     Mental Status: She is alert and oriented to person, place, and time.     Coordination: Coordination normal.     Vitals:   07/22/18 1519  BP: 118/78  Pulse: 65  Temp: 98.7 F (37.1 C)  TempSrc: Oral  SpO2: 99%  Weight: 212 lb (96.2 kg)  Height: 5\' 6"  (1.676 m)    Assessment & Plan:

## 2018-07-23 DIAGNOSIS — R109 Unspecified abdominal pain: Secondary | ICD-10-CM | POA: Insufficient documentation

## 2018-07-23 NOTE — Assessment & Plan Note (Signed)
Could be related to ovarian cyst rupture. Getting US pelvis. Could also be gallbladder spasm and checking [place] RUQ abdomen as well. Checking CBC, CMP, lipase to rule out other conditions.

## 2018-07-23 NOTE — Assessment & Plan Note (Signed)
Could be related to sleeping position or muscular. Advised that she can take tylenol or ibuprofen otc to help with pain if needed. No need for imaging today.

## 2018-07-23 NOTE — Addendum Note (Signed)
Addended by: Hillard Danker A on: 07/23/2018 09:08 AM   Modules accepted: Orders

## 2018-07-30 ENCOUNTER — Other Ambulatory Visit: Payer: Self-pay | Admitting: Internal Medicine

## 2018-07-30 DIAGNOSIS — R102 Pelvic and perineal pain: Secondary | ICD-10-CM

## 2018-08-09 ENCOUNTER — Other Ambulatory Visit: Payer: BLUE CROSS/BLUE SHIELD

## 2018-08-12 ENCOUNTER — Other Ambulatory Visit: Payer: BLUE CROSS/BLUE SHIELD

## 2018-08-27 ENCOUNTER — Ambulatory Visit: Payer: Self-pay

## 2018-08-27 NOTE — Telephone Encounter (Signed)
Incoming call from Patient with a complaint  Of SOB, cough, light chest tension.  Patient reports Her cough is dry,  Breathing problem onset was last week.  Patient thought it was allergies.   States it is  Constant.  Rates it moderate.  Has a history of asthma.  States she was dizzy yesterday.  Not today.  Reports a runny nose.  Denies a fever.  Presently on cycle now.  Reports chest  Tightness that is  Constant.  Occurs middle to left.  States that it is moderate.  Reviewed protocol with Patient.  Recommends Patient be evaluated at either [facility], or Urgent Care.  Patient  Chose Urgent Care.      Reason for Disposition . Difficulty breathing  Answer Assessment - Initial Assessment Questions 1. RESPIRATORY STATUS: "Describe your breathing?" (e.g., wheezing, shortness of breath, unable to speak, severe coughing)      Shortness of breath 2. ONSET: "When did this breathing problem begin?"      Last week  3. PATTERN "Does the difficult breathing come and go, or has it been constant since it started?"      *No Answer*constant 4. SEVERITY: "How bad is your breathing?" (e.g., mild, moderate, severe)    - MILD: No SOB at rest, mild SOB with walking, speaks normally in sentences, can lay down, no retractions, pulse < 100.    - MODERATE: SOB at rest, SOB with minimal exertion and prefers to sit, cannot lie down flat, speaks in phrases, mild retractions, audible wheezing, pulse 100-120.    - SEVERE: Very SOB at rest, speaks in single words, struggling to breathe, sitting hunched forward, retractions, pulse > 120      Moderate, taking deep breath 5. RECURRENT SYMPTOM: "Have you had difficulty breathing before?" If so, ask: "When was the last time?" and "What happened that time?"      As a child 6. CARDIAC HISTORY: "Do you have any history of heart disease?" (e.g., heart attack, angina, bypass surgery, angioplasty)      denies 7. LUNG HISTORY: "Do you have any history of lung disease?"  (e.g., pulmonary embolus,  asthma, emphysema)     asthma 8. CAUSE: "What do you think is causing the breathing problem?"       9. OTHER SYMPTOMS: "Do you have any other symptoms? (e.g., dizziness, runny nose, cough, chest pain, fever)     Runny nose , chest tightness,   Denies fever 10. PREGNANCY: "Is there any chance you are pregnant?" "When was your last menstrual period?"        On cycle  Now last day.   11. TRAVEL: "Have you traveled out of the country in the last month?" (e.g., travel history, exposures)       denies  Answer Assessment - Initial Assessment Questions 1. LOCATION: "Where does it hurt?"       Middle to left 2. RADIATION: "Does the pain go anywhere else?" (e.g., into neck, jaw, arms, back)     Back  Left side3. ONSET: "When did the chest pain begin?" (Minutes, hours or days)      *No Answer* 4. PATTERN "Does the pain come and go, or has it been constant since it started?"  "Does it get worse with exertion?"      Constant today 5. DURATION: "How long does it last" (e.g., seconds, minutes, hours)     *No Answer* 6. SEVERITY: "How bad is the pain?"  (e.g., Scale 1-10; mild, moderate, or severe)    -  MILD (1-3): doesn't interfere with normal activities     - MODERATE (4-7): interferes with normal activities or awakens from sleep    - SEVERE (8-10): excruciating pain, unable to do any normal activities        moderate 7. CARDIAC RISK FACTORS: "Do you have any history of heart problems or risk factors for heart disease?" (e.g., prior heart attack, angina; high blood pressure, diabetes, being overweight, high cholesterol, smoking, or strong family history of heart disease)     denies 8. PULMONARY RISK FACTORS: "Do you have any history of lung disease?"  (e.g., blood clots in lung, asthma, emphysema, birth control pills)      asthma9. CAUSE: "What do you think is causing the chest pain?"     *No Answer* 10. OTHER SYMPTOMS: "Do you have any other symptoms?" (e.g., dizziness, nausea, vomiting, sweating,  fever, difficulty breathing, cough)       Denies , dizzy yesterday 11. PREGNANCY: "Is there any chance you are pregnant?" "When was your last menstrual period?"       *No Answer*  Protocols used: CHEST PAIN-A-AH, BREATHING DIFFICULTY-A-AH

## 2018-08-27 NOTE — Telephone Encounter (Signed)
fyi patient going to urgent care

## 2018-11-08 DIAGNOSIS — H18602 Keratoconus, unspecified, left eye: Secondary | ICD-10-CM | POA: Diagnosis not present

## 2018-11-08 DIAGNOSIS — Z947 Corneal transplant status: Secondary | ICD-10-CM | POA: Diagnosis not present

## 2018-12-02 ENCOUNTER — Encounter (HOSPITAL_COMMUNITY): Payer: Self-pay | Admitting: Emergency Medicine

## 2018-12-02 ENCOUNTER — Emergency Department (HOSPITAL_COMMUNITY)
Admission: EM | Admit: 2018-12-02 | Discharge: 2018-12-02 | Disposition: A | Discharge status: Home / Self Care | Payer: BC Managed Care – PPO | Attending: Emergency Medicine | Admitting: Emergency Medicine

## 2018-12-02 ENCOUNTER — Other Ambulatory Visit: Payer: Self-pay

## 2018-12-02 DIAGNOSIS — M545 Low back pain: Secondary | ICD-10-CM | POA: Diagnosis not present

## 2018-12-02 DIAGNOSIS — Z5321 Procedure and treatment not carried out due to patient leaving prior to being seen by health care provider: Secondary | ICD-10-CM | POA: Diagnosis not present

## 2018-12-02 LAB — COMPREHENSIVE METABOLIC PANEL
ALT: 15 U/L (ref 0–44)
AST: 16 U/L (ref 15–41)
Albumin: 3.6 g/dL (ref 3.5–5.0)
Alkaline Phosphatase: 74 U/L (ref 38–126)
Anion gap: 8 (ref 5–15)
BUN: 9 mg/dL (ref 6–20)
CO2: 24 mmol/L (ref 22–32)
Calcium: 8.4 mg/dL — ABNORMAL LOW (ref 8.9–10.3)
Chloride: 102 mmol/L (ref 98–111)
Creatinine, Ser: 0.77 mg/dL (ref 0.44–1.00)
GFR calc Af Amer: >60 mL/min (ref >60)
GFR calc non Af Amer: >60 mL/min (ref >60)
Glucose, Bld: 104 mg/dL — ABNORMAL HIGH (ref 70–99)
Potassium: 3.6 mmol/L (ref 3.5–5.1)
Sodium: 134 mmol/L — ABNORMAL LOW (ref 135–145)
Total Bilirubin: 0.5 mg/dL (ref 0.3–1.2)
Total Protein: 6.8 g/dL (ref 6.5–8.1)

## 2018-12-02 LAB — URINALYSIS, ROUTINE W REFLEX MICROSCOPIC
Bilirubin Urine: NEGATIVE
Glucose, UA: NEGATIVE mg/dL
Hgb urine dipstick: NEGATIVE
Ketones, ur: 20 mg/dL — AB
Leukocytes,Ua: NEGATIVE
Nitrite: NEGATIVE
Protein, ur: 30 mg/dL — AB
Specific Gravity, Urine: 1.019 (ref 1.005–1.030)
pH: 6 (ref 5.0–8.0)

## 2018-12-02 LAB — CBC
HCT: 33.3 % — ABNORMAL LOW (ref 36.0–46.0)
Hemoglobin: 10.5 g/dL — ABNORMAL LOW (ref 12.0–15.0)
MCH: 27.7 pg (ref 26.0–34.0)
MCHC: 31.5 g/dL (ref 30.0–36.0)
MCV: 87.9 fL (ref 80.0–100.0)
Platelets: 412 10*3/uL — ABNORMAL HIGH (ref 150–400)
RBC: 3.79 MIL/uL — ABNORMAL LOW (ref 3.87–5.11)
RDW: 13.5 % (ref 11.5–15.5)
WBC: 6.3 10*3/uL (ref 4.0–10.5)
nRBC: 0 % (ref 0.0–0.2)

## 2018-12-02 LAB — I-STAT BETA HCG BLOOD, ED (MC, WL, AP ONLY): I-stat hCG, quantitative: <5 m[IU]/mL (ref <5)

## 2018-12-02 LAB — LIPASE, BLOOD: Lipase: 26 U/L (ref 11–51)

## 2018-12-02 MED ORDER — SODIUM CHLORIDE 0.9% FLUSH: 3.0000 mL | Freq: Once | INTRAVENOUS | Status: DC

## 2018-12-02 NOTE — ED Notes (Signed)
[facility] EMT saw patient go outside earlier. Pt hasn't returned

## 2018-12-02 NOTE — ED Triage Notes (Signed)
C/o cramping to lower back that radiates to lower abd since 8pm.  Nausea only during ride to hospital.  Denies vomiting and diarrhea.  LMP 2 weeks ago.

## 2018-12-09 ENCOUNTER — Encounter (HOSPITAL_COMMUNITY): Payer: Self-pay

## 2018-12-09 ENCOUNTER — Other Ambulatory Visit: Payer: Self-pay

## 2018-12-09 ENCOUNTER — Emergency Department (HOSPITAL_COMMUNITY)
Admission: EM | Admit: 2018-12-09 | Discharge: 2018-12-09 | Disposition: A | Discharge status: Home / Self Care | Payer: BC Managed Care – PPO | Attending: Emergency Medicine | Admitting: Emergency Medicine

## 2018-12-09 ENCOUNTER — Emergency Department (HOSPITAL_COMMUNITY): Payer: BC Managed Care – PPO

## 2018-12-09 DIAGNOSIS — R0602 Shortness of breath: Secondary | ICD-10-CM | POA: Diagnosis not present

## 2018-12-09 DIAGNOSIS — M545 Low back pain, unspecified: Secondary | ICD-10-CM

## 2018-12-09 DIAGNOSIS — Z87891 Personal history of nicotine dependence: Secondary | ICD-10-CM | POA: Diagnosis not present

## 2018-12-09 DIAGNOSIS — Z79899 Other long term (current) drug therapy: Secondary | ICD-10-CM | POA: Insufficient documentation

## 2018-12-09 DIAGNOSIS — M549 Dorsalgia, unspecified: Secondary | ICD-10-CM | POA: Diagnosis not present

## 2018-12-09 DIAGNOSIS — M5489 Other dorsalgia: Secondary | ICD-10-CM | POA: Diagnosis not present

## 2018-12-09 DIAGNOSIS — R52 Pain, unspecified: Secondary | ICD-10-CM | POA: Diagnosis not present

## 2018-12-09 DIAGNOSIS — R079 Chest pain, unspecified: Secondary | ICD-10-CM | POA: Insufficient documentation

## 2018-12-09 DIAGNOSIS — R072 Precordial pain: Secondary | ICD-10-CM | POA: Diagnosis not present

## 2018-12-09 LAB — TROPONIN I (HIGH SENSITIVITY): Troponin I (High Sensitivity): <2 ng/L (ref <18)

## 2018-12-09 LAB — COMPREHENSIVE METABOLIC PANEL
ALT: 36 U/L (ref 0–44)
AST: 41 U/L (ref 15–41)
Albumin: 3.9 g/dL (ref 3.5–5.0)
Alkaline Phosphatase: 81 U/L (ref 38–126)
Anion gap: 9 (ref 5–15)
BUN: 8 mg/dL (ref 6–20)
CO2: 25 mmol/L (ref 22–32)
Calcium: 8.8 mg/dL — ABNORMAL LOW (ref 8.9–10.3)
Chloride: 103 mmol/L (ref 98–111)
Creatinine, Ser: 0.63 mg/dL (ref 0.44–1.00)
GFR calc Af Amer: >60 mL/min (ref >60)
GFR calc non Af Amer: >60 mL/min (ref >60)
Glucose, Bld: 109 mg/dL — ABNORMAL HIGH (ref 70–99)
Potassium: 3.3 mmol/L — ABNORMAL LOW (ref 3.5–5.1)
Sodium: 137 mmol/L (ref 135–145)
Total Bilirubin: 0.5 mg/dL (ref 0.3–1.2)
Total Protein: 7.5 g/dL (ref 6.5–8.1)

## 2018-12-09 LAB — CBC
HCT: 36.4 % (ref 36.0–46.0)
Hemoglobin: 11.5 g/dL — ABNORMAL LOW (ref 12.0–15.0)
MCH: 28 pg (ref 26.0–34.0)
MCHC: 31.6 g/dL (ref 30.0–36.0)
MCV: 88.6 fL (ref 80.0–100.0)
Platelets: 379 10*3/uL (ref 150–400)
RBC: 4.11 MIL/uL (ref 3.87–5.11)
RDW: 14.1 % (ref 11.5–15.5)
WBC: 6.3 10*3/uL (ref 4.0–10.5)
nRBC: 0 % (ref 0.0–0.2)

## 2018-12-09 LAB — URINALYSIS, ROUTINE W REFLEX MICROSCOPIC
Bilirubin Urine: NEGATIVE
Glucose, UA: NEGATIVE mg/dL
Hgb urine dipstick: NEGATIVE
Ketones, ur: NEGATIVE mg/dL
Leukocytes,Ua: NEGATIVE
Nitrite: NEGATIVE
Protein, ur: NEGATIVE mg/dL
Specific Gravity, Urine: 1.009 (ref 1.005–1.030)
pH: 7 (ref 5.0–8.0)

## 2018-12-09 LAB — LIPASE, BLOOD: Lipase: 36 U/L (ref 11–51)

## 2018-12-09 LAB — I-STAT BETA HCG BLOOD, ED (MC, WL, AP ONLY): I-stat hCG, quantitative: <5 m[IU]/mL (ref <5)

## 2018-12-09 MED ORDER — SODIUM CHLORIDE 0.9% FLUSH: 3.0000 mL | Freq: Once | INTRAVENOUS | Status: DC

## 2018-12-09 MED ORDER — CYCLOBENZAPRINE HCL 10 MG PO TABS: 10.0000 mg | ORAL_TABLET | Freq: Two times a day (BID) | ORAL | 0 refills | Status: DC | PRN

## 2018-12-09 NOTE — ED Notes (Signed)
Pt transported to radiology.

## 2018-12-09 NOTE — ED Notes (Signed)
Pt verbalized discharge instructions and follow up care. Alert and ambulatory. No IV.  

## 2018-12-09 NOTE — ED Notes (Signed)
Patient has a extra tube of blood in thge main lab one gold

## 2018-12-09 NOTE — ED Triage Notes (Signed)
Per EMS, complaining back pain radiating to right lower abdomin[symptoms ongoing for 1 month-has been seen in ED twice for symtoms

## 2018-12-09 NOTE — ED Provider Notes (Signed)
[facility]-EMERGENCY DEPT Provider Note   CSN: [id number] Arrival date & time: 12/09/18  1452    History   Chief Complaint Chief Complaint  Patient presents with  . Abdominal Pain  . Flank Pain    HPI Traci Finley is a 28 y.o. female with PMHx anemia, anxiety, asthma who presents to the ED complaining of gradual onset, intermittent, lower mid back pain x 1 month although pt has had these symptoms on and off for the past 2 years. Pt reports the pain will radiate to her right lower abdomen at times as well. She usually takes Tylenol for the pain but reports it was severe today. Pt also complains of sudden onset substernal chest pain that began when her back pain started today. Pt states she felt short of breath as well. She states the chest pain was the main reason she called EMS because it concerned her. The pain lasted about 30-40 minutes prior to subsiding. Pt has never had pain like this in the past. Denies fever, chills, nausea, vomiting, diaphoresis, diarrhea, constipation, urinary sx, leg swelling, or any other associated symptoms. No recent prolonged travel or immobilization. No hx DVT/PE. No OCPs. No hemoptysis.   Per chart review pt was seen in the ED in 2018 for similar pain - right flank pain radiating into abdomen. Full workup including CT renal stone study which was negative. Questionable passed kidney stone at that time vs muscular pain/spasms. Pt was advised to take Tylenol PRN for pain, given a Rx for robaxin, and told to follow up with her PCP.        Past Medical History:  Diagnosis Date  . Anemia   . Anxiety   . Asthma   . Back pain   . Chest pain   . Depression   . Frequent headaches   . Joint pain   . Persistent headaches   . Shortness of breath     Patient Active Problem List   Diagnosis Date Noted  . Abdominal pain 07/23/2018  . Vitamin D deficiency 06/20/2017  . 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 Right-sided low back pain without  sciatica 01/09/2017    Past Surgical History:  Procedure Laterality Date  . CORNEAL TRANSPLANT    . WISDOM TOOTH EXTRACTION       OB History    Gravida  0   Para  0   Term  0   Preterm  0   AB  0   Living  0     SAB  0   TAB  0   Ectopic  0   Multiple  0   Live Births  0            Home Medications    Prior to Admission medications   Medication Sig Start Date End Date Taking? Authorizing Provider  ibuprofen (ADVIL) 200 MG tablet Take 400 mg by mouth every 6 (six) hours as needed for moderate pain.   Yes [provider]  cyclobenzaprine (FLEXERIL) 10 MG tablet Take 1 tablet (10 mg total) by mouth 2 (two) times daily as needed for muscle spasms. 12/09/18   Tanda RockersVenter, Javiel Canepa, PA-C    Family History Family History  Problem Relation Age of Onset  . Arthritis Mother   . Hypertension Mother   . Cancer Mother        thyroid  . Thyroid disease Mother   . Obesity Mother   . Diabetes Maternal Uncle   . Arthritis Maternal Grandmother   .  Hypertension Maternal Grandmother   . Heart disease Maternal Grandmother   . Diabetes Maternal Grandmother   . Cancer Maternal Grandmother        thyroid    Social History Social History   Tobacco Use  . Smoking status: Former Smoker    Packs/day: 12.00    Years: 2.00    Pack years: 24.00    Quit date: 2015    Years since quitting: 5.5  . Smokeless tobacco: Never Used  Substance Use Topics  . Alcohol use: No  . Drug use: No     Allergies   Patient has no known allergies.   Review of Systems Review of Systems  Constitutional: Negative for chills and fever.  HENT: Negative for congestion.   Eyes: Negative for visual disturbance.  Respiratory: Positive for shortness of breath. Negative for cough.   Cardiovascular: Positive for chest pain. Negative for palpitations and leg swelling.  Gastrointestinal: Positive for abdominal pain. Negative for constipation, diarrhea, nausea and vomiting.   Genitourinary: Negative for dysuria, flank pain and frequency.  Musculoskeletal: Positive for back pain.  Skin: Negative for rash.  Neurological: Negative for headaches.     Physical Exam Updated Vital Signs BP 131/80   Pulse 85   Temp 98.2 F (36.8 C) (Oral)   Resp 20   Ht 5\' 5"  (1.651 m)   Wt 92.1 kg   LMP 11/18/2018 Comment: neg preg test  SpO2 100%   BMI 33.78 kg/m   Physical Exam Vitals signs and nursing note reviewed.  Constitutional:      Appearance: She is not ill-appearing.  HENT:     Head: Normocephalic and atraumatic.  Eyes:     Conjunctiva/sclera: Conjunctivae normal.  Neck:     Musculoskeletal: Neck supple.  Cardiovascular:     Rate and Rhythm: Normal rate and regular rhythm.     Heart sounds: Normal heart sounds.  Pulmonary:     Effort: Pulmonary effort is normal.     Breath sounds: Normal breath sounds. No wheezing, rhonchi or rales.  Chest:     Chest wall: No tenderness.  Abdominal:     General: Bowel sounds are normal.     Palpations: Abdomen is soft.     Tenderness: There is no abdominal tenderness. There is no right CVA tenderness, left CVA tenderness, guarding or rebound. Negative signs include Murphy's sign and McBurney's sign.  Musculoskeletal:     Comments: L spine midline tenderness to palpation without paraspinal musculature TTP. No C or T spine tenderness. No tenderness to bilateral hips. ROM intact throughout. Strength 5/5 to BLEs. Sensation intact. Good distal pulses.   Skin:    General: Skin is warm and dry.  Neurological:     Mental Status: She is alert.      ED Treatments / Results  Labs (all labs ordered are listed, but only abnormal results are displayed) Labs Reviewed  COMPREHENSIVE METABOLIC PANEL - Abnormal; Notable for the following components:      Result Value   Potassium 3.3 (*)    Glucose, Bld 109 (*)    Calcium 8.8 (*)    All other components within normal limits  CBC - Abnormal; Notable for the following  components:   Hemoglobin 11.5 (*)    All other components within normal limits  LIPASE, BLOOD  URINALYSIS, ROUTINE W REFLEX MICROSCOPIC  I-STAT BETA HCG BLOOD, ED ([facility], [facility], [facility] ONLY)  TROPONIN I (HIGH SENSITIVITY)    EKG EKG Interpretation  Date/Time:  Monday December 09 2018 20:13:12 EDT Ventricular Rate:  74 PR Interval:    QRS Duration: 91 QT Interval:  380 QTC Calculation: 422 R Axis:   17 Text Interpretation:  Sinus rhythm Borderline T abnormalities, anterior leads Baseline wander in lead(s) V1 no prior to compare with Confirmed by Meridee ScoreButler, Michael [phone]([id number]) on 12/09/2018 8:15:03 PM   Radiology Dg Chest 1 View  Result Date: 12/09/2018 CLINICAL DATA:  Back pain EXAM: CHEST  1 VIEW COMPARISON:  None. FINDINGS: The heart size and mediastinal contours are within normal limits. Both lungs are clear. The visualized skeletal structures are unremarkable. IMPRESSION: No active disease. Electronically Signed   By: Marlan Palauharles  Clark M.D.   On: 12/09/2018 20:58   Dg Lumbar Spine Complete  Result Date: 12/09/2018 CLINICAL DATA:  Low back pain radiating to RIGHT lower abdomen for approximately a month. EXAM: LUMBAR SPINE - COMPLETE 4+ VIEW COMPARISON:  None. FINDINGS: There is no evidence of lumbar spine fracture. Alignment is normal. Intervertebral disc spaces are maintained. No evidence of pars interarticularis defect. Visualized paravertebral soft tissues are unremarkable. IMPRESSION: Negative. Electronically Signed   By: Bary RichardStan  Maynard M.D.   On: 12/09/2018 20:58    Procedures Procedures (including critical care time)  Medications Ordered in ED Medications  sodium chloride flush (NS) 0.9 % injection 3 mL (has no administration in time range)     Initial Impression / Assessment and Plan / ED Course  I have reviewed the triage vital signs and the nursing notes.  Pertinent labs & imaging results that were available during my care of the patient were reviewed by me and considered in my medical  decision making (see chart for details).  28 year old otherwise healthy female who initially presented for back pain radiating into abdomen intermittent for the past month. On exam she reports she was actually having chest pain with this pain which prompted her to call EMS. PERC negative. Baseline bloodwork obtained prior to being seen - no electrolyte derangements. Lipase negative. And U/A without infection. Pt without abdominal tenderness on exam even though she reports pain intermittently goes to her RLQ or her LLQ when she is having back pain. Seen in the ED in 2018 for "similar pain" with a full negative work up at that time. Questionable musculoskeletal pain which I suspect although pt has midline lumbar tenderness on exam. No redflags to suggest cauda equina. No injury. Will obtain DG L spine although do not suspect any findings. Will workup chest pain today although given age very low suspicion for ACS at this time as well. Vital signs stable today and pt has equal pulses to extremities bilaterally. Do not suspect aneurysm vs dissection today even with chest pain and lower back pain. Suspect separate etiology. Will reevaluate once labs and imaging return.   CXR negative. Initial trop < 2. X ray of spine negative as well. Pt has been resting comfortably while in the ED watching TV. She appears to be in NAD. She has not had chest pain since earlier today. Feel her back pain is likely MSK in nature and she may have gotten anxious and had chest pain. Do not feel pt needs repeat trop at this time as I seriously doubt ACS. Will discharge patient home with flexeril and advise to follow up with PCP. She is in agreement with plan at this time and stable for discharge home.   Clinical Course as of Dec 09 2147  Mon Dec 09, 2018  1925 Increased from previous  HemoglobinMarland Kitchen(!):  11.5 [MV]    Clinical Course User Index [MV] Tanda RockersVenter, Semaya Vida, PA-C         Final Clinical Impressions(s) / ED Diagnoses   Final  diagnoses:  Acute midline low back pain without sciatica  Chest pain, unspecified type    ED Discharge Orders         Ordered    cyclobenzaprine (FLEXERIL) 10 MG tablet  2 times daily PRN     12/09/18 2147           Tanda RockersVenter, Sydny Schnitzler, PA-C 12/10/18 0044    Terrilee FilesButler, Michael C, MD 12/10/18 1314

## 2018-12-09 NOTE — Discharge Instructions (Signed)
You were seen in the ED today for back pain and chest pain Your labwork and xrays were very reassuring Your back pain is likely due to musculoskeletal pain. Please take the muscle relaxers as prescribed. 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

## 2018-12-11 DIAGNOSIS — Z947 Corneal transplant status: Secondary | ICD-10-CM | POA: Diagnosis not present

## 2018-12-11 DIAGNOSIS — H18602 Keratoconus, unspecified, left eye: Secondary | ICD-10-CM | POA: Diagnosis not present

## 2018-12-12 ENCOUNTER — Ambulatory Visit
Admission: RE | Admit: 2018-12-12 | Discharge: 2018-12-12 | Disposition: A | Discharge status: Home / Self Care | Payer: BC Managed Care – PPO | Source: Ambulatory Visit | Attending: Internal Medicine | Admitting: Internal Medicine

## 2018-12-12 ENCOUNTER — Other Ambulatory Visit: Payer: Self-pay | Admitting: Internal Medicine

## 2018-12-12 DIAGNOSIS — R102 Pelvic and perineal pain: Secondary | ICD-10-CM | POA: Diagnosis not present

## 2018-12-12 DIAGNOSIS — R109 Unspecified abdominal pain: Secondary | ICD-10-CM

## 2018-12-12 DIAGNOSIS — K8020 Calculus of gallbladder without cholecystitis without obstruction: Secondary | ICD-10-CM | POA: Diagnosis not present

## 2018-12-13 ENCOUNTER — Encounter (HOSPITAL_COMMUNITY): Payer: Self-pay | Admitting: Emergency Medicine

## 2018-12-13 ENCOUNTER — Other Ambulatory Visit: Payer: Self-pay

## 2018-12-13 ENCOUNTER — Observation Stay (HOSPITAL_COMMUNITY)
Admission: EM | Admit: 2018-12-13 | Discharge: 2018-12-15 | Disposition: A | Discharge status: Home / Self Care | Payer: BC Managed Care – PPO | Attending: Surgery | Admitting: Surgery

## 2018-12-13 ENCOUNTER — Encounter: Payer: Self-pay | Admitting: Internal Medicine

## 2018-12-13 ENCOUNTER — Ambulatory Visit (INDEPENDENT_AMBULATORY_CARE_PROVIDER_SITE_OTHER): Payer: BC Managed Care – PPO | Admitting: Internal Medicine

## 2018-12-13 VITALS — BP 120/82 | HR 69 | Temp 98.6°F | Ht 65.0 in | Wt 206.0 lb

## 2018-12-13 DIAGNOSIS — Z6833 Body mass index (BMI) 33.0-33.9, adult: Secondary | ICD-10-CM | POA: Diagnosis not present

## 2018-12-13 DIAGNOSIS — Z87891 Personal history of nicotine dependence: Secondary | ICD-10-CM | POA: Insufficient documentation

## 2018-12-13 DIAGNOSIS — Z20828 Contact with and (suspected) exposure to other viral communicable diseases: Secondary | ICD-10-CM | POA: Diagnosis not present

## 2018-12-13 DIAGNOSIS — Z03818 Encounter for observation for suspected exposure to other biological agents ruled out: Secondary | ICD-10-CM | POA: Diagnosis not present

## 2018-12-13 DIAGNOSIS — Z947 Corneal transplant status: Secondary | ICD-10-CM | POA: Insufficient documentation

## 2018-12-13 DIAGNOSIS — K8012 Calculus of gallbladder with acute and chronic cholecystitis without obstruction: Principal | ICD-10-CM | POA: Insufficient documentation

## 2018-12-13 DIAGNOSIS — E669 Obesity, unspecified: Secondary | ICD-10-CM | POA: Insufficient documentation

## 2018-12-13 DIAGNOSIS — K8000 Calculus of gallbladder with acute cholecystitis without obstruction: Secondary | ICD-10-CM | POA: Diagnosis not present

## 2018-12-13 DIAGNOSIS — K8010 Calculus of gallbladder with chronic cholecystitis without obstruction: Secondary | ICD-10-CM

## 2018-12-13 DIAGNOSIS — R109 Unspecified abdominal pain: Secondary | ICD-10-CM | POA: Diagnosis present

## 2018-12-13 LAB — CBC WITH DIFFERENTIAL/PLATELET
Abs Immature Granulocytes: 0.01 10*3/uL (ref 0.00–0.07)
Basophils Absolute: 0 10*3/uL (ref 0.0–0.1)
Basophils Relative: 0 %
Eosinophils Absolute: 0.1 10*3/uL (ref 0.0–0.5)
Eosinophils Relative: 2 %
HCT: 41.1 % (ref 36.0–46.0)
Hemoglobin: 12.5 g/dL (ref 12.0–15.0)
Immature Granulocytes: 0 %
Lymphocytes Relative: 44 %
Lymphs Abs: 2.4 10*3/uL (ref 0.7–4.0)
MCH: 27.1 pg (ref 26.0–34.0)
MCHC: 30.4 g/dL (ref 30.0–36.0)
MCV: 89.2 fL (ref 80.0–100.0)
Monocytes Absolute: 0.5 10*3/uL (ref 0.1–1.0)
Monocytes Relative: 10 %
Neutro Abs: 2.4 10*3/uL (ref 1.7–7.7)
Neutrophils Relative %: 44 %
Platelets: 466 10*3/uL — ABNORMAL HIGH (ref 150–400)
RBC: 4.61 MIL/uL (ref 3.87–5.11)
RDW: 14.3 % (ref 11.5–15.5)
WBC: 5.5 10*3/uL (ref 4.0–10.5)
nRBC: 0 % (ref 0.0–0.2)

## 2018-12-13 LAB — URINALYSIS, ROUTINE W REFLEX MICROSCOPIC
Bilirubin Urine: NEGATIVE
Glucose, UA: NEGATIVE mg/dL
Hgb urine dipstick: NEGATIVE
Ketones, ur: 5 mg/dL — AB
Nitrite: NEGATIVE
Protein, ur: NEGATIVE mg/dL
Specific Gravity, Urine: 1.012 (ref 1.005–1.030)
pH: 6 (ref 5.0–8.0)

## 2018-12-13 LAB — COMPREHENSIVE METABOLIC PANEL
ALT: 299 U/L — ABNORMAL HIGH (ref 0–44)
AST: 61 U/L — ABNORMAL HIGH (ref 15–41)
Albumin: 4.6 g/dL (ref 3.5–5.0)
Alkaline Phosphatase: 148 U/L — ABNORMAL HIGH (ref 38–126)
Anion gap: 11 (ref 5–15)
BUN: 12 mg/dL (ref 6–20)
CO2: 24 mmol/L (ref 22–32)
Calcium: 9.2 mg/dL (ref 8.9–10.3)
Chloride: 105 mmol/L (ref 98–111)
Creatinine, Ser: 0.74 mg/dL (ref 0.44–1.00)
GFR calc Af Amer: >60 mL/min (ref >60)
GFR calc non Af Amer: >60 mL/min (ref >60)
Glucose, Bld: 88 mg/dL (ref 70–99)
Potassium: 3.6 mmol/L (ref 3.5–5.1)
Sodium: 140 mmol/L (ref 135–145)
Total Bilirubin: 0.6 mg/dL (ref 0.3–1.2)
Total Protein: 9.1 g/dL — ABNORMAL HIGH (ref 6.5–8.1)

## 2018-12-13 LAB — I-STAT BETA HCG BLOOD, ED (MC, WL, AP ONLY): I-stat hCG, quantitative: <5 m[IU]/mL (ref <5)

## 2018-12-13 LAB — PROTIME-INR
INR: 0.9 (ref 0.8–1.2)
Prothrombin Time: 12.1 seconds (ref 11.4–15.2)

## 2018-12-13 LAB — SARS CORONAVIRUS 2 BY RT PCR (HOSPITAL ORDER, PERFORMED IN ~~LOC~~ HOSPITAL LAB): SARS Coronavirus 2: NEGATIVE

## 2018-12-13 LAB — LIPASE, BLOOD: Lipase: 26 U/L (ref 11–51)

## 2018-12-13 MED ORDER — SODIUM CHLORIDE 0.9 % IV BOLUS
1000.0000 mL | Freq: Once | INTRAVENOUS | Status: AC
  Administered 2018-12-13: 1000 mL via INTRAVENOUS

## 2018-12-13 MED ORDER — SODIUM CHLORIDE 0.9 % IV SOLN
2.0000 g | Freq: Once | INTRAVENOUS | Status: AC
  Administered 2018-12-13: 2 g via INTRAVENOUS
  Filled 2018-12-13: qty 20

## 2018-12-13 MED ORDER — MORPHINE SULFATE (PF) 4 MG/ML IV SOLN
4.0000 mg | Freq: Once | INTRAVENOUS | Status: AC
  Administered 2018-12-13: 4 mg via INTRAVENOUS
  Filled 2018-12-13: qty 1

## 2018-12-13 MED ORDER — SODIUM CHLORIDE 0.9 % IV SOLN
2.0000 g | INTRAVENOUS | Status: DC
  Administered 2018-12-14: 2 g via INTRAVENOUS
  Filled 2018-12-13: qty 2

## 2018-12-13 MED ORDER — ACETAMINOPHEN 650 MG RE SUPP: 650.0000 mg | Freq: Four times a day (QID) | RECTAL | Status: DC | PRN

## 2018-12-13 MED ORDER — ONDANSETRON 4 MG PO TBDP
4.0000 mg | ORAL_TABLET | Freq: Four times a day (QID) | ORAL | Status: DC | PRN
  Administered 2018-12-14: 4 mg via ORAL
  Filled 2018-12-13: qty 1

## 2018-12-13 MED ORDER — HYDROCODONE-ACETAMINOPHEN 5-325 MG PO TABS
1.0000 | ORAL_TABLET | ORAL | Status: DC | PRN
  Administered 2018-12-14: 1 via ORAL
  Filled 2018-12-13: qty 1

## 2018-12-13 MED ORDER — HYDROMORPHONE HCL 1 MG/ML IJ SOLN: 1.0000 mg | INTRAMUSCULAR | Status: DC | PRN

## 2018-12-13 MED ORDER — ACETAMINOPHEN 325 MG PO TABS
650.0000 mg | ORAL_TABLET | Freq: Four times a day (QID) | ORAL | Status: DC | PRN
  Administered 2018-12-14 – 2018-12-15 (×4): 650 mg via ORAL
  Filled 2018-12-13 (×4): qty 2

## 2018-12-13 MED ORDER — ONDANSETRON HCL 4 MG/2ML IJ SOLN: 4.0000 mg | Freq: Four times a day (QID) | INTRAMUSCULAR | Status: DC | PRN

## 2018-12-13 MED ORDER — KCL IN DEXTROSE-NACL 20-5-0.45 MEQ/L-%-% IV SOLN
INTRAVENOUS | Status: DC
  Administered 2018-12-13 – 2018-12-15 (×3): via INTRAVENOUS
  Filled 2018-12-13 (×3): qty 1000

## 2018-12-13 MED ORDER — ONDANSETRON HCL 4 MG/2ML IJ SOLN
4.0000 mg | Freq: Once | INTRAMUSCULAR | Status: AC
  Administered 2018-12-13: 4 mg via INTRAVENOUS
  Filled 2018-12-13: qty 2

## 2018-12-13 NOTE — ED Notes (Signed)
ED TO INPATIENT HANDOFF REPORT  Name/Age/Gender Traci Finley 28 y.o. female  Code Status    Code Status Orders  (From admission, onward)         Start     Ordered   12/13/18 1749  Full code  Continuous     12/13/18 1751        Code Status History    This patient has a current code status but no historical code status.   Advance Care Planning Activity      Home/SNF/Other Home  Chief Complaint chest / abd / back pain   Level of Care/Admitting Diagnosis ED Disposition    ED Disposition Condition [facility] Area: [place] [100102]  Level of Care: Med-Surg [16]  Covid Evaluation: Confirmed COVID Negative  Diagnosis: Cholecystitis with cholelithiasis [606301]  Admitting Physician: CCS, Calzada  Attending Physician: CCS, MD [[id number]  PT Class (Do Not Modify): Observation [104]  PT Acc Code (Do Not Modify): Observation [10022]       Medical History Past Medical History:  Diagnosis Date  . Anemia   . Anxiety   . Asthma   . Back pain   . Chest pain   . Depression   . Frequent headaches   . Joint pain   . Persistent headaches   . Shortness of breath     Allergies No Known Allergies  IV Location/Drains/Wounds Patient Lines/Drains/Airways Status   Active Line/Drains/Airways    Name:   Placement date:   Placement time:   Site:   Days:   Peripheral IV 12/13/18 Left Antecubital   12/13/18    1745    Antecubital   less than 1          Labs/Imaging Results for orders placed or performed during the hospital encounter of 12/13/18 (from the past 48 hour(s))  CBC with Differential     Status: Abnormal   Collection Time: 12/13/18  5:37 PM  Result Value Ref Range   WBC 5.5 4.0 - 10.5 K/uL   RBC 4.61 3.87 - 5.11 MIL/uL   Hemoglobin 12.5 12.0 - 15.0 g/dL   HCT 41.1 36.0 - 46.0 %   MCV 89.2 80.0 - 100.0 fL   MCH 27.1 26.0 - 34.0 pg   MCHC 30.4 30.0 - 36.0 g/dL   RDW 14.3 11.5 - 15.5 %   Platelets 466 (H) 150 - 400  K/uL   nRBC 0.0 0.0 - 0.2 %   Neutrophils Relative % 44 %   Neutro Abs 2.4 1.7 - 7.7 K/uL   Lymphocytes Relative 44 %   Lymphs Abs 2.4 0.7 - 4.0 K/uL   Monocytes Relative 10 %   Monocytes Absolute 0.5 0.1 - 1.0 K/uL   Eosinophils Relative 2 %   Eosinophils Absolute 0.1 0.0 - 0.5 K/uL   Basophils Relative 0 %   Basophils Absolute 0.0 0.0 - 0.1 K/uL   Immature Granulocytes 0 %   Abs Immature Granulocytes 0.01 0.00 - 0.07 K/uL    Comment: Performed at [facility], [place] [address], [place], [place] [address]  Protime-INR     Status: None   Collection Time: 12/13/18  5:37 PM  Result Value Ref Range   Prothrombin Time 12.1 11.4 - 15.2 seconds   INR 0.9 0.8 - 1.2    Comment: (NOTE) INR goal varies based on device and disease states. Performed at [facility], [place] [address] [place], [place] [address]  Urinalysis, Routine w reflex microscopic     Status: Abnormal   Collection Time: 12/13/18  5:37 PM  Result Value Ref Range   Color, Urine YELLOW YELLOW   APPearance CLEAR CLEAR   Specific Gravity, Urine 1.012 1.005 - 1.030   pH 6.0 5.0 - 8.0   Glucose, UA NEGATIVE NEGATIVE mg/dL   Hgb urine dipstick NEGATIVE NEGATIVE   Bilirubin Urine NEGATIVE NEGATIVE   Ketones, ur 5 (A) NEGATIVE mg/dL   Protein, ur NEGATIVE NEGATIVE mg/dL   Nitrite NEGATIVE NEGATIVE   Leukocytes,Ua TRACE (A) NEGATIVE   RBC / HPF 0-5 0 - 5 RBC/hpf   WBC, UA 0-5 0 - 5 WBC/hpf   Bacteria, UA RARE (A) NONE SEEN   Squamous Epithelial / LPF 0-5 0 - 5    Comment: Performed at [facility], [address] [place], [place] [id number]  I-Stat Beta hCG blood, ED ([facility], [facility], [facility] only)     Status: None   Collection Time: 12/13/18  6:03 PM  Result Value Ref Range   I-stat hCG, quantitative <5.0 <5 mIU/mL   Comment 3            Comment:   GEST. AGE      CONC.  (mIU/mL)   <=1 WEEK        5 - 50     2 WEEKS       50 - 500     3 WEEKS       100 - 10,000     4 WEEKS      1,000 - 30,000        FEMALE AND NON-PREGNANT FEMALE:     LESS THAN 5 mIU/mL    [place] Pelvis (transabdominal Only)  Result Date: 12/12/2018 CLINICAL DATA:  Pelvic pain EXAM: TRANSABDOMINAL ULTRASOUND OF PELVIS TECHNIQUE: Transabdominal ultrasound examination of the pelvis was performed including evaluation of the uterus, ovaries, adnexal regions, and pelvic cul-de-sac. Patient declined transvaginal study. COMPARISON:  April 28, 2014 FINDINGS: Uterus Measurements: 8.8 x 5.7 x 4.6 cm = volume: 121.6 mL. No fibroids or other mass visualized. Uterus anteverted. Endometrium Thickness: 14 mm. No focal abnormality visualized. Endometrial contour smooth. Right ovary Measurements: 5.2 x 3.5 x 2.0 cm = volume: 19.1 mL. Normal appearance/no adnexal mass. Left ovary Measurements: 3.1 x 1.7 x1.9 cm = volume: 5.0 mL. Normal appearance/no adnexal mass. Other findings:  No abnormal free fluid. IMPRESSION: Study within normal limits. Electronically Signed   By: Bretta BangWilliam  Woodruff III M.D.   On: 12/12/2018 16:55   [place] Abdomen Limited Ruq  Result Date: 12/12/2018 CLINICAL DATA:  Upper abdominal pain EXAM: ULTRASOUND ABDOMEN LIMITED RIGHT UPPER QUADRANT COMPARISON:  None. FINDINGS: Gallbladder: The gallbladder is somewhat contracted. There are echogenic foci in the gallbladder which move and shadow consistent with cholelithiasis. There is a 1.5 cm calculus which appears adherent in the region of the neck of the gallbladder. The gallbladder wall appears mildly thickened. No pericholecystic fluid. No sonographic Murphy sign noted by sonographer. Common bile duct: Diameter: 5 mm. No intrahepatic or extrahepatic biliary duct dilatation. Liver: No focal lesion identified. Liver echogenicity is overall increased. Portal vein is patent on color Doppler imaging with normal direction of blood flow towards the liver. Other: None. IMPRESSION: 1. Cholelithiasis with apparent gallstone adherent in the neck of the gallbladder. Gallbladder  wall is mildly thickened with contracted gallbladder. Concern for a degree of acute cholecystitis. 2. Increased liver echogenicity, a finding felt to be indicative of a degree  of hepatic steatosis. No focal liver lesions evident. These results will be called to the ordering clinician or representative by the Radiologist Assistant, and communication documented in the PACS or zVision Dashboard. Electronically Signed   By: Bretta BangWilliam  Woodruff III M.D.   On: 12/12/2018 16:51    Pending Labs Unresulted Labs (From admission, onward)    Start     Ordered   12/13/18 1749  HIV antibody (Routine Testing)  Once,   STAT     12/13/18 1751   12/13/18 1736  SARS Coronavirus 2 [facility](Hospital order, Performed in [facility] hospital lab)  Once,   R     12/13/18 1736   12/13/18 1645  Lipase, blood  ONCE - STAT,   STAT     12/13/18 1644   12/13/18 1644  Comprehensive metabolic panel  Once,   STAT     12/13/18 1644          Vitals/Pain Today's Vitals   12/13/18 1247 12/13/18 1623 12/13/18 1630 12/13/18 1715  BP: (!) 137/102 114/65 107/71 117/69  Pulse: 80 68 68 65  Resp: 18 18    Temp: 98.3 F (36.8 C)     TempSrc: Oral     SpO2: 100% 100% 100% 100%  PainSc: 8        Isolation Precautions No active isolations  Medications Medications  dextrose 5 % and 0.45 % NaCl with KCl 20 mEq/L infusion (has no administration in time range)  cefTRIAXone (ROCEPHIN) 2 g in sodium chloride 0.9 % 100 mL IVPB (has no administration in time range)  acetaminophen (TYLENOL) tablet 650 mg (has no administration in time range)    Or  acetaminophen (TYLENOL) suppository 650 mg (has no administration in time range)  HYDROcodone-acetaminophen (NORCO/VICODIN) 5-325 MG per tablet 1-2 tablet (has no administration in time range)  HYDROmorphone (DILAUDID) injection 1 mg (has no administration in time range)  ondansetron (ZOFRAN-ODT) disintegrating tablet 4 mg (has no administration in time range)    Or  ondansetron (ZOFRAN)  injection 4 mg (has no administration in time range)  cefTRIAXone (ROCEPHIN) 2 g in sodium chloride 0.9 % 100 mL IVPB (0 g Intravenous Stopped 12/13/18 1829)  ondansetron (ZOFRAN) injection 4 mg (4 mg Intravenous Given 12/13/18 1743)  morphine 4 MG/ML injection 4 mg (4 mg Intravenous Given 12/13/18 1746)  sodium chloride 0.9 % bolus 1,000 mL (0 mLs Intravenous Stopped 12/13/18 1829)    Mobility walks

## 2018-12-13 NOTE — ED Notes (Signed)
Attempted to call report. Spoke with Network engineer and told nurse, Marlowe Kays,  just received new pt and will be calling me back for report ASAP.

## 2018-12-13 NOTE — ED Triage Notes (Signed)
Pt reports that she had an ultrasound today with PCP and was told to go to ED for infected gallbladder.

## 2018-12-13 NOTE — Progress Notes (Signed)
   Subjective:   Patient ID: Traci Finley, female    DOB: [birth year], 28 y.o.   MRN: [id number]  HPI The patient is a 28 YO female coming in for abdominal pain RUQ. She was seen back in March for occasional pain and [place] ordered. Due to pandemic this was not done until yesterday. Meanwhile 3 days ago she had a very severe, long lasting pain episode of middle back, chest, and upper stomach pain 10/10. They did troponins and back x-ray and sent her home with muscle relaxer. She denies vomiting at that time but was having chills due to the pain. Eventually that pain went away after hours. She had some pain the following day. Denies nausea or vomiting. Has been able to eat. Denies fevers or chills. The [place] yesterday showed some concerns for acute cholecystitis. She is having tenderness today still. She would like to avoid ER if possible.   Review of Systems  Constitutional: Positive for activity change and appetite change. Negative for chills, fatigue, fever and unexpected weight change.  HENT: Negative.   Eyes: Negative.   Respiratory: Negative for cough, chest tightness and shortness of breath.   Cardiovascular: Negative for chest pain, palpitations and leg swelling.  Gastrointestinal: Positive for abdominal distention, abdominal pain and nausea. Negative for anal bleeding, blood in stool, constipation, diarrhea, rectal pain and vomiting.  Musculoskeletal: Negative.   Skin: Negative.   Neurological: Negative.   Psychiatric/Behavioral: Negative.     Objective:  Physical Exam Constitutional:      Appearance: She is well-developed.  HENT:     Head: Normocephalic and atraumatic.  Neck:     Musculoskeletal: Normal range of motion.  Cardiovascular:     Rate and Rhythm: Normal rate and regular rhythm.  Pulmonary:     Effort: Pulmonary effort is normal. No respiratory distress.     Breath sounds: Normal breath sounds. No wheezing or rales.  Abdominal:     General: Bowel sounds are normal.  There is no distension.     Palpations: Abdomen is soft.     Tenderness: There is abdominal tenderness. There is no right CVA tenderness, left CVA tenderness, guarding or rebound.     Comments: No rebound or guarding. Normal BS. RUQ tenderness moderate. No murphy sign elicited today  Skin:    General: Skin is warm and dry.  Neurological:     Mental Status: She is alert and oriented to person, place, and time.     Coordination: Coordination normal.     Vitals:   12/13/18 1035  BP: 120/82  Pulse: 69  Temp: 98.6 F (37 C)  TempSrc: Oral  SpO2: 99%  Weight: 206 lb (93.4 kg)  Height: 5\' 5"  (1.651 m)    Assessment & Plan:  Visit time 25 minutes: greater than 50% of that time was spent in face to face counseling and coordination of care with the patient: counseled about course and gallbladder disease as well as surgical management

## 2018-12-13 NOTE — Assessment & Plan Note (Addendum)
She may have acute cholecystitis given [place] and history. She is not febrile and pain is controlled at this time. Urgent general surgery referral. However after discussion with surgeon's office they cannot get patient in sooner than a week and recommend sending her to the ER for more immediate surgical evaluation. She is sent over and labs canceled as they will be done there.

## 2018-12-13 NOTE — H&P (Signed)
Traci Finley is an 28 y.o. female.    General Surgery [facility]  Chief Complaint: unrelenting biliary colic, cholecystitis, cholelithiasis  HPI: Patient is a 28 year old female who presents to the emergency department with unrelenting upper abdominal pain for the past 3 days.  Patient has had intermittent episodes of epigastric and right upper quadrant abdominal pain for the past 2 years.  Episodes have become more frequent and closer together.  She was evaluated earlier this week in the emergency department.  She underwent ultrasound examination which demonstrates mild thickening of the gallbladder wall with cholelithiasis and apparently a stone impacted in the neck of the gallbladder.  There is no biliary dilatation.  Laboratory studies show normal white blood cell count and normal liver function test.  Patient denies any history of hepatobiliary or pancreatic disease.  She denies any previous abdominal surgery.  There is no immediate family history of gallbladder disease.  Patient was evaluated in the emergency department and has had persistent pain.  General surgery is now consulted for evaluation and management.  Patient denies any history of jaundice or acholic stools.  She has no history of hepatitis or pancreatitis.  Past Medical History:  Diagnosis Date  . Anemia   . Anxiety   . Asthma   . Back pain   . Chest pain   . Depression   . Frequent headaches   . Joint pain   . Persistent headaches   . Shortness of breath     Past Surgical History:  Procedure Laterality Date  . CORNEAL TRANSPLANT    . WISDOM TOOTH EXTRACTION      Family History  Problem Relation Age of Onset  . Arthritis Mother   . Hypertension Mother   . Cancer Mother        thyroid  . Thyroid disease Mother   . Obesity Mother   . Diabetes Maternal Uncle   . Arthritis Maternal Grandmother   . Hypertension Maternal Grandmother   . Heart disease Maternal Grandmother   . Diabetes  Maternal Grandmother   . Cancer Maternal Grandmother        thyroid   Social History:  reports that she quit smoking about 5 years ago. She has a 24.00 pack-year smoking history. She has never used smokeless tobacco. She reports that she does not drink alcohol or use drugs.  Allergies: No Known Allergies  (Not in a hospital admission)   No results found for this or any previous visit (from the past 48 hour(s)). [place] Pelvis (transabdominal Only)  Result Date: 12/12/2018 CLINICAL DATA:  Pelvic pain EXAM: TRANSABDOMINAL ULTRASOUND OF PELVIS TECHNIQUE: Transabdominal ultrasound examination of the pelvis was performed including evaluation of the uterus, ovaries, adnexal regions, and pelvic cul-de-sac. Patient declined transvaginal study. COMPARISON:  April 28, 2014 FINDINGS: Uterus Measurements: 8.8 x 5.7 x 4.6 cm = volume: 121.6 mL. No fibroids or other mass visualized. Uterus anteverted. Endometrium Thickness: 14 mm. No focal abnormality visualized. Endometrial contour smooth. Right ovary Measurements: 5.2 x 3.5 x 2.0 cm = volume: 19.1 mL. Normal appearance/no adnexal mass. Left ovary Measurements: 3.1 x 1.7 x1.9 cm = volume: 5.0 mL. Normal appearance/no adnexal mass. Other findings:  No abnormal free fluid. IMPRESSION: Study within normal limits. Electronically Signed   By: Bretta BangWilliam  Woodruff III M.D.   On: 12/12/2018 16:55   [place] Abdomen Limited Ruq  Result Date: 12/12/2018 CLINICAL DATA:  Upper abdominal pain EXAM: ULTRASOUND ABDOMEN LIMITED RIGHT UPPER QUADRANT COMPARISON:  None. FINDINGS: Gallbladder:  The gallbladder is somewhat contracted. There are echogenic foci in the gallbladder which move and shadow consistent with cholelithiasis. There is a 1.5 cm calculus which appears adherent in the region of the neck of the gallbladder. The gallbladder wall appears mildly thickened. No pericholecystic fluid. No sonographic Murphy sign noted by sonographer. Common bile duct: Diameter: 5 mm. No  intrahepatic or extrahepatic biliary duct dilatation. Liver: No focal lesion identified. Liver echogenicity is overall increased. Portal vein is patent on color Doppler imaging with normal direction of blood flow towards the liver. Other: None. IMPRESSION: 1. Cholelithiasis with apparent gallstone adherent in the neck of the gallbladder. Gallbladder wall is mildly thickened with contracted gallbladder. Concern for a degree of acute cholecystitis. 2. Increased liver echogenicity, a finding felt to be indicative of a degree of hepatic steatosis. No focal liver lesions evident. These results will be called to the ordering clinician or representative by the Radiologist Assistant, and communication documented in the PACS or zVision Dashboard. Electronically Signed   By: Lowella Grip III M.D.   On: 12/12/2018 16:51    Review of Systems  Constitutional: Negative for chills, diaphoresis and fever.  HENT: Negative.   Respiratory: Negative.   Cardiovascular: Negative.   Gastrointestinal: Positive for abdominal pain (epigastric and RUQ) and constipation.  Genitourinary: Negative.   Musculoskeletal: Negative.   Skin: Negative.   Neurological: Negative.   Endo/Heme/Allergies: Negative.   Psychiatric/Behavioral: Negative.     Blood pressure 114/65, pulse 68, temperature 98.3 F (36.8 C), temperature source Oral, resp. rate 18, last menstrual period 11/18/2018, SpO2 100 %. Physical Exam  Constitutional: She is oriented to person, place, and time. She appears well-developed and well-nourished. No distress.  HENT:  Head: Normocephalic and atraumatic.  Right Ear: External ear normal.  Left Ear: External ear normal.  Eyes: Pupils are equal, round, and reactive to light. Conjunctivae are normal. No scleral icterus.  Neck: Normal range of motion. Neck supple. No tracheal deviation present. No thyromegaly present.  Cardiovascular: Normal rate, regular rhythm and intact distal pulses.  No murmur  heard. Respiratory: Effort normal and breath sounds normal. No respiratory distress. She has no wheezes.  GI: Soft. Bowel sounds are normal. She exhibits no distension and no mass. There is abdominal tenderness (epigastric and RUQ, mild to moderate). There is no rebound and no guarding.  Musculoskeletal: Normal range of motion.        General: No deformity or edema.  Neurological: She is alert and oriented to person, place, and time.  Skin: Skin is warm and dry. She is not diaphoretic.  Psychiatric: She has a normal mood and affect. Her behavior is normal.     Assessment/Plan Cholecystitis, cholelithiasis, unrelenting biliary colic  Admit to surgical service  Allow clear liquids, NPO after MN  Empiric abx started by ER MD  Plan lap chole with IOC in AM 8/1  I have discussed options for management with the patient.  I have recommended admission for cholecystectomy due to unrelenting biliary colic.  We discussed performing the surgery by laparoscopic technique.  We discussed the risk and benefits of surgery.  We discussed the hospital stay.  We discussed her postoperative recovery and return to activities.  She wishes to proceed.  The risks and benefits of the procedure have been discussed at length with the patient.  The patient understands the proposed procedure, potential alternative treatments, and the course of recovery to be expected.  All of the patient's questions have been answered at this time.  The patient wishes to proceed with surgery.  Darnell Levelodd Javelle Donigan, MD [facility] Office: [phone]    Darnell Levelodd Salihah Peckham, MD 12/13/2018, 5:42 PM

## 2018-12-13 NOTE — ED Notes (Signed)
Attempted to call report to floor, asked to call back in 5 minutes.

## 2018-12-14 ENCOUNTER — Observation Stay (HOSPITAL_COMMUNITY): Payer: BC Managed Care – PPO

## 2018-12-14 ENCOUNTER — Encounter (HOSPITAL_COMMUNITY): Payer: Self-pay | Admitting: Certified Registered Nurse Anesthetist

## 2018-12-14 ENCOUNTER — Observation Stay (HOSPITAL_COMMUNITY): Payer: BC Managed Care – PPO | Admitting: Anesthesiology

## 2018-12-14 ENCOUNTER — Encounter (HOSPITAL_COMMUNITY)
Admission: EM | Disposition: A | Discharge status: Home / Self Care | Payer: Self-pay | Source: Home / Self Care | Attending: Emergency Medicine

## 2018-12-14 DIAGNOSIS — K8010 Calculus of gallbladder with chronic cholecystitis without obstruction: Secondary | ICD-10-CM | POA: Diagnosis not present

## 2018-12-14 DIAGNOSIS — K8020 Calculus of gallbladder without cholecystitis without obstruction: Secondary | ICD-10-CM | POA: Diagnosis not present

## 2018-12-14 DIAGNOSIS — D649 Anemia, unspecified: Secondary | ICD-10-CM | POA: Diagnosis not present

## 2018-12-14 DIAGNOSIS — K8012 Calculus of gallbladder with acute and chronic cholecystitis without obstruction: Secondary | ICD-10-CM | POA: Diagnosis not present

## 2018-12-14 DIAGNOSIS — F418 Other specified anxiety disorders: Secondary | ICD-10-CM | POA: Diagnosis not present

## 2018-12-14 DIAGNOSIS — J45909 Unspecified asthma, uncomplicated: Secondary | ICD-10-CM | POA: Diagnosis not present

## 2018-12-14 HISTORY — PX: CHOLECYSTECTOMY: SHX55

## 2018-12-14 LAB — SURGICAL PCR SCREEN
MRSA, PCR: NEGATIVE
Staphylococcus aureus: POSITIVE — AB

## 2018-12-14 SURGERY — LAPAROSCOPIC CHOLECYSTECTOMY WITH INTRAOPERATIVE CHOLANGIOGRAM
Anesthesia: General | Site: Abdomen

## 2018-12-14 MED ORDER — CELECOXIB 200 MG PO CAPS
200.0000 mg | ORAL_CAPSULE | Freq: Once | ORAL | Status: AC
  Administered 2018-12-14: 200 mg via ORAL

## 2018-12-14 MED ORDER — FENTANYL CITRATE (PF) 100 MCG/2ML IJ SOLN
INTRAMUSCULAR | Status: AC
  Filled 2018-12-14: qty 2

## 2018-12-14 MED ORDER — ONDANSETRON HCL 4 MG/2ML IJ SOLN
INTRAMUSCULAR | Status: AC
  Filled 2018-12-14: qty 2

## 2018-12-14 MED ORDER — FENTANYL CITRATE (PF) 250 MCG/5ML IJ SOLN
INTRAMUSCULAR | Status: AC
  Filled 2018-12-14: qty 5

## 2018-12-14 MED ORDER — BUPIVACAINE-EPINEPHRINE 0.25% -1:200000 IJ SOLN
INTRAMUSCULAR | Status: DC | PRN
  Administered 2018-12-14: 30 mL

## 2018-12-14 MED ORDER — SUGAMMADEX SODIUM 200 MG/2ML IV SOLN
INTRAVENOUS | Status: DC | PRN
  Administered 2018-12-14: 200 mg via INTRAVENOUS

## 2018-12-14 MED ORDER — LIDOCAINE 2% (20 MG/ML) 5 ML SYRINGE
INTRAMUSCULAR | Status: DC | PRN
  Administered 2018-12-14: 100 mg via INTRAVENOUS

## 2018-12-14 MED ORDER — LACTATED RINGERS IV SOLN
INTRAVENOUS | Status: AC | PRN
  Administered 2018-12-14: 1000 mL

## 2018-12-14 MED ORDER — SUCCINYLCHOLINE CHLORIDE 200 MG/10ML IV SOSY
PREFILLED_SYRINGE | INTRAVENOUS | Status: DC | PRN
  Administered 2018-12-14: 100 mg via INTRAVENOUS

## 2018-12-14 MED ORDER — PROPOFOL 10 MG/ML IV BOLUS
INTRAVENOUS | Status: DC | PRN
  Administered 2018-12-14: 150 mg via INTRAVENOUS

## 2018-12-14 MED ORDER — DEXAMETHASONE SODIUM PHOSPHATE 10 MG/ML IJ SOLN
INTRAMUSCULAR | Status: DC | PRN
  Administered 2018-12-14: 10 mg via INTRAVENOUS

## 2018-12-14 MED ORDER — PROPOFOL 10 MG/ML IV BOLUS
INTRAVENOUS | Status: AC
  Filled 2018-12-14: qty 20

## 2018-12-14 MED ORDER — LIDOCAINE 2% (20 MG/ML) 5 ML SYRINGE
INTRAMUSCULAR | Status: AC
  Filled 2018-12-14: qty 5

## 2018-12-14 MED ORDER — 0.9 % SODIUM CHLORIDE (POUR BTL) OPTIME
TOPICAL | Status: DC | PRN
  Administered 2018-12-14: 1000 mL

## 2018-12-14 MED ORDER — ACETAMINOPHEN 500 MG PO TABS
1000.0000 mg | ORAL_TABLET | Freq: Once | ORAL | Status: AC
  Administered 2018-12-14: 1000 mg via ORAL

## 2018-12-14 MED ORDER — DIPHENHYDRAMINE HCL 50 MG/ML IJ SOLN
INTRAMUSCULAR | Status: DC | PRN
  Administered 2018-12-14: 12.5 mg via INTRAVENOUS

## 2018-12-14 MED ORDER — DIPHENHYDRAMINE HCL 50 MG/ML IJ SOLN
INTRAMUSCULAR | Status: AC
  Filled 2018-12-14: qty 1

## 2018-12-14 MED ORDER — PHENYLEPHRINE 40 MCG/ML (10ML) SYRINGE FOR IV PUSH (FOR BLOOD PRESSURE SUPPORT)
PREFILLED_SYRINGE | INTRAVENOUS | Status: DC | PRN
  Administered 2018-12-14: 80 ug via INTRAVENOUS
  Administered 2018-12-14: 40 ug via INTRAVENOUS

## 2018-12-14 MED ORDER — SCOPOLAMINE 1 MG/3DAYS TD PT72
MEDICATED_PATCH | TRANSDERMAL | Status: AC
  Filled 2018-12-14: qty 1

## 2018-12-14 MED ORDER — ROCURONIUM BROMIDE 10 MG/ML (PF) SYRINGE
PREFILLED_SYRINGE | INTRAVENOUS | Status: AC
  Filled 2018-12-14: qty 10

## 2018-12-14 MED ORDER — LACTATED RINGERS IV SOLN
INTRAVENOUS | Status: DC
  Administered 2018-12-14: 12:00:00 via INTRAVENOUS

## 2018-12-14 MED ORDER — ONDANSETRON HCL 4 MG/2ML IJ SOLN
INTRAMUSCULAR | Status: DC | PRN
  Administered 2018-12-14: 4 mg via INTRAVENOUS

## 2018-12-14 MED ORDER — GABAPENTIN 300 MG PO CAPS
ORAL_CAPSULE | ORAL | Status: AC
  Filled 2018-12-14: qty 1

## 2018-12-14 MED ORDER — ACETAMINOPHEN 500 MG PO TABS
ORAL_TABLET | ORAL | Status: AC
  Filled 2018-12-14: qty 2

## 2018-12-14 MED ORDER — LACTATED RINGERS IV SOLN
INTRAVENOUS | Status: DC | PRN
  Administered 2018-12-14: 09:00:00 via INTRAVENOUS

## 2018-12-14 MED ORDER — IOHEXOL 300 MG/ML  SOLN
INTRAMUSCULAR | Status: DC | PRN
  Administered 2018-12-14: 9 mL

## 2018-12-14 MED ORDER — CELECOXIB 200 MG PO CAPS
ORAL_CAPSULE | ORAL | Status: AC
  Filled 2018-12-14: qty 1

## 2018-12-14 MED ORDER — SCOPOLAMINE 1 MG/3DAYS TD PT72
1.0000 | MEDICATED_PATCH | Freq: Once | TRANSDERMAL | Status: DC
  Administered 2018-12-14: 1.5 mg via TRANSDERMAL

## 2018-12-14 MED ORDER — FENTANYL CITRATE (PF) 100 MCG/2ML IJ SOLN
INTRAMUSCULAR | Status: DC | PRN
  Administered 2018-12-14 (×2): 50 ug via INTRAVENOUS
  Administered 2018-12-14: 100 ug via INTRAVENOUS
  Administered 2018-12-14: 50 ug via INTRAVENOUS

## 2018-12-14 MED ORDER — FENTANYL CITRATE (PF) 100 MCG/2ML IJ SOLN
25.0000 ug | INTRAMUSCULAR | Status: DC | PRN
  Administered 2018-12-14 (×2): 50 ug via INTRAVENOUS

## 2018-12-14 MED ORDER — MIDAZOLAM HCL 2 MG/2ML IJ SOLN
INTRAMUSCULAR | Status: AC
  Filled 2018-12-14: qty 2

## 2018-12-14 MED ORDER — MIDAZOLAM HCL 5 MG/5ML IJ SOLN
INTRAMUSCULAR | Status: DC | PRN
  Administered 2018-12-14: 2 mg via INTRAVENOUS

## 2018-12-14 MED ORDER — PROMETHAZINE HCL 25 MG/ML IJ SOLN: 6.2500 mg | INTRAMUSCULAR | Status: DC | PRN

## 2018-12-14 MED ORDER — GABAPENTIN 300 MG PO CAPS
300.0000 mg | ORAL_CAPSULE | Freq: Once | ORAL | Status: AC
  Administered 2018-12-14: 300 mg via ORAL

## 2018-12-14 MED ORDER — MUPIROCIN 2 % EX OINT
1.0000 "application " | TOPICAL_OINTMENT | Freq: Two times a day (BID) | CUTANEOUS | Status: DC
  Administered 2018-12-14 – 2018-12-15 (×3): 1 via NASAL
  Filled 2018-12-14: qty 22

## 2018-12-14 MED ORDER — ROCURONIUM BROMIDE 50 MG/5ML IV SOSY
PREFILLED_SYRINGE | INTRAVENOUS | Status: DC | PRN
  Administered 2018-12-14: 40 mg via INTRAVENOUS

## 2018-12-14 MED ORDER — SUCCINYLCHOLINE CHLORIDE 200 MG/10ML IV SOSY
PREFILLED_SYRINGE | INTRAVENOUS | Status: AC
  Filled 2018-12-14: qty 10

## 2018-12-14 SURGICAL SUPPLY — 32 items
APPLIER CLIP ROT 10 11.4 M/L (STAPLE) ×3
CABLE HIGH FREQUENCY MONO STRZ (ELECTRODE) ×3 IMPLANT
CHLORAPREP W/TINT 26 (MISCELLANEOUS) ×3 IMPLANT
CLIP APPLIE ROT 10 11.4 M/L (STAPLE) ×1 IMPLANT
CLOSURE WOUND 1/2 X4 (GAUZE/BANDAGES/DRESSINGS)
COVER MAYO STAND STRL (DRAPES) ×3 IMPLANT
COVER SURGICAL LIGHT HANDLE (MISCELLANEOUS) ×3 IMPLANT
COVER WAND RF STERILE (DRAPES) IMPLANT
DECANTER SPIKE VIAL GLASS SM (MISCELLANEOUS) ×3 IMPLANT
DRAPE C-ARM 42X120 X-RAY (DRAPES) ×3 IMPLANT
ELECT REM PT RETURN 15FT ADLT (MISCELLANEOUS) ×3 IMPLANT
GAUZE SPONGE 2X2 8PLY STRL LF (GAUZE/BANDAGES/DRESSINGS) ×1 IMPLANT
GLOVE SURG ORTHO 8.0 STRL STRW (GLOVE) ×3 IMPLANT
GOWN STRL REUS W/TWL XL LVL3 (GOWN DISPOSABLE) ×6 IMPLANT
HEMOSTAT SURGICEL 4X8 (HEMOSTASIS) IMPLANT
KIT BASIN OR (CUSTOM PROCEDURE TRAY) ×3 IMPLANT
KIT TURNOVER KIT A (KITS) IMPLANT
POUCH SPECIMEN RETRIEVAL 10MM (ENDOMECHANICALS) ×3 IMPLANT
SCISSORS LAP 5X35 DISP (ENDOMECHANICALS) ×3 IMPLANT
SET CHOLANGIOGRAPH MIX (MISCELLANEOUS) ×3 IMPLANT
SET IRRIG TUBING LAPAROSCOPIC (IRRIGATION / IRRIGATOR) ×3 IMPLANT
SET TUBE SMOKE EVAC HIGH FLOW (TUBING) IMPLANT
SLEEVE XCEL OPT CAN 5 100 (ENDOMECHANICALS) ×3 IMPLANT
SPONGE GAUZE 2X2 STER 10/PKG (GAUZE/BANDAGES/DRESSINGS) ×2
STRIP CLOSURE SKIN 1/2X4 (GAUZE/BANDAGES/DRESSINGS) IMPLANT
SUT MNCRL AB 4-0 PS2 18 (SUTURE) ×3 IMPLANT
TOWEL OR 17X26 10 PK STRL BLUE (TOWEL DISPOSABLE) ×3 IMPLANT
TOWEL OR NON WOVEN STRL DISP B (DISPOSABLE) ×3 IMPLANT
TRAY LAPAROSCOPIC (CUSTOM PROCEDURE TRAY) ×3 IMPLANT
TROCAR BLADELESS OPT 5 100 (ENDOMECHANICALS) ×3 IMPLANT
TROCAR XCEL BLUNT TIP 100MML (ENDOMECHANICALS) ×3 IMPLANT
TROCAR XCEL NON-BLD 11X100MML (ENDOMECHANICALS) ×3 IMPLANT

## 2018-12-14 NOTE — Op Note (Signed)
Procedure Note  Pre-operative Diagnosis:  Cholecystitis, cholelithiasis, unrelenting biliary colic  Post-operative Diagnosis:  same  Surgeon:  Armandina Gemma, MD  Assistant:  none   Procedure:  Laparoscopic cholecystectomy with intra-operative cholangiography  Anesthesia:  General  Estimated Blood Loss:  minimal  Drains: none         Specimen: gallbladder to pathology  Indications:  Patient is a 28 year old female who presents to the emergency department with unrelenting upper abdominal pain for the past 3 days.  Patient has had intermittent episodes of epigastric and right upper quadrant abdominal pain for the past 2 years.  Episodes have become more frequent and closer together.  She was evaluated earlier this week in the emergency department.  She underwent ultrasound examination which demonstrates mild thickening of the gallbladder wall with cholelithiasis and apparently a stone impacted in the neck of the gallbladder.  There is no biliary dilatation.  Laboratory studies show normal white blood cell count and normal liver function test.  Patient denies any history of hepatobiliary or pancreatic disease.  She denies any previous abdominal surgery.  There is no immediate family history of gallbladder disease.  Patient was evaluated in the emergency department and has had persistent pain.  General surgery is now consulted for evaluation and management.  Procedure Details:  The patient was seen in the pre-op holding area. The risks, benefits, complications, treatment options, and expected outcomes were previously discussed with the patient. The patient agreed with the proposed plan and has signed the informed consent form.  The patient was transported to operating room # 1 at the [facility]. The patient was placed in the supine position on the operating room table. Following induction of general anesthesia, the abdomen was prepped and draped in the usual aseptic fashion.  An incision  was made in the skin near the umbilicus. The midline fascia was incised and the peritoneal cavity was entered and a Hasson cannula was introduced under direct vision. The cannula was secured with a 0-Vicryl pursestring suture. Pneumoperitoneum was established with carbon dioxide. Additional cannulae were introduced under direct vision along the right costal margin in the midline, mid-clavicular line, and anterior axillary line.   The gallbladder was identified and the fundus grasped and retracted cephalad. Adhesions were taken down bluntly and the electrocautery was utilized as needed, taking care not to involve any adjacent structures. The infundibulum was grasped and retracted laterally, exposing the peritoneum overlying the triangle of Calot. The peritoneum was incised and structures exposed with blunt dissection. The cystic duct was clearly identified, bluntly dissected circumferentially, and clipped at the neck of the gallbladder.  An incision was made in the cystic duct and the cholangiogram catheter introduced. The catheter was secured using an ligaclip.  Real-time cholangiography was performed using C-arm fluoroscopy.  There was rapid filling of a normal caliber common bile duct.  There was reflux of contrast into the left and right hepatic ductal systems.  There was free flow distally into the duodenum without filling defect or obstruction.  The catheter was removed from the peritoneal cavity.  The cystic duct was then ligated with ligaclips and divided. The cystic artery was identified, dissected circumferentially, ligated with ligaclips, and divided.  The gallbladder was dissected away from the gallbladder bed using the electrocautery for hemostasis. The gallbladder was completely removed from the liver and placed into an endocatch bag. The gallbladder was removed in the endocatch bag through the umbilical port site and submitted to pathology for review.  The right  upper quadrant was irrigated  and the gallbladder bed was inspected. Hemostasis was achieved with the electrocautery.  Cannulae were removed under direct vision and good hemostasis was noted. Pneumoperitoneum was released and the majority of the carbon dioxide evacuated. The umbilical wound was irrigated and the fascia was then closed with the pursestring suture.  Local anesthetic was infiltrated at all port sites. Skin incisions were closed with 4-0 Monocril subcuticular sutures and Dermabond was applied.  Instrument, sponge, and needle counts were correct at the conclusion of the case.  The patient was awakened from anesthesia and brought to the recovery room in stable condition.  The patient tolerated the procedure well.   Darnell Levelodd Amairani Shuey, MD [facility] Office: [phone]

## 2018-12-14 NOTE — Anesthesia Postprocedure Evaluation (Signed)
Anesthesia Post Note  Patient: Traci Finley  Procedure(s) Performed: LAPAROSCOPIC CHOLECYSTECTOMY WITH INTRAOPERATIVE CHOLANGIOGRAM (N/A Abdomen)     Patient location during evaluation: PACU Anesthesia Type: General Level of consciousness: awake and alert Pain management: pain level controlled Vital Signs Assessment: post-procedure vital signs reviewed and stable Respiratory status: spontaneous breathing, nonlabored ventilation, respiratory function stable and patient connected to nasal cannula oxygen Cardiovascular status: blood pressure returned to baseline and stable Postop Assessment: no apparent nausea or vomiting Anesthetic complications: no    Last Vitals:  Vitals:   12/14/18 1100 12/14/18 1115  BP: 126/67 125/68  Pulse: 78 73  Resp: 19 (!) 23  Temp: 36.9 C   SpO2: 100% 100%    Last Pain:  Vitals:   12/14/18 1100  TempSrc:   PainSc: 0-No pain                 Catalina Gravel

## 2018-12-14 NOTE — Plan of Care (Signed)
Walking in hall.   Doing well managing pain.

## 2018-12-14 NOTE — Plan of Care (Signed)

## 2018-12-14 NOTE — Progress Notes (Signed)
Pt back from pacu in stable condition. No needs at this time. VS stable on arrival to floor.

## 2018-12-14 NOTE — Progress Notes (Signed)
Pt to pacu in stable condition. No needs at time of transport. No changes in pt overall condition.

## 2018-12-14 NOTE — Anesthesia Procedure Notes (Signed)
Procedure Name: Intubation Date/Time: 12/14/2018 9:40 AM Performed by: Montel Clock, CRNA Pre-anesthesia Checklist: Patient identified, Emergency Drugs available, Suction available, Patient being monitored and Timeout performed Patient Re-evaluated:Patient Re-evaluated prior to induction Oxygen Delivery Method: Circle system utilized Preoxygenation: Pre-oxygenation with 100% oxygen Induction Type: IV induction and Rapid sequence Laryngoscope Size: Mac and 3 Grade View: Grade I Tube type: Oral Tube size: 7.0 mm Number of attempts: 1 Airway Equipment and Method: Stylet Placement Confirmation: ETT inserted through vocal cords under direct vision,  positive ETCO2 and breath sounds checked- equal and bilateral Secured at: 21 cm Tube secured with: Tape Dental Injury: Teeth and Oropharynx as per pre-operative assessment

## 2018-12-14 NOTE — Interval H&P Note (Signed)
History and Physical Interval Note:  12/14/2018 9:28 AM  Traci Finley  has presented today for surgery, with the diagnosis of CHOLELITHIASIS.  The various methods of treatment have been discussed with the patient and family. After consideration of risks, benefits and other options for treatment, the patient has consented to  Procedure(s): LAPAROSCOPIC CHOLECYSTECTOMY WITH INTRAOPERATIVE CHOLANGIOGRAM (N/A) as a surgical intervention.  The patient's history has been reviewed, patient examined, no change in status, stable for surgery.  I have reviewed the patient's chart and labs.  Questions were answered to the patient's satisfaction.     Armandina Gemma

## 2018-12-14 NOTE — ED Provider Notes (Signed)
[facility] Provider Note   CSN: [id number] Arrival date & time: 12/13/18  1232     History   Chief Complaint Chief Complaint  Patient presents with  . Abdominal Pain  . sent for infected gallbladder    HPI Traci Finley is a 28 y.o. female.     HPI  28 year old female comes in a chief complaint of gallbladder pain. She reports that she was seen in the ER recently with abdominal discomfort.  She was discharged from the ED because her labs are reassuring and she had pending ultrasound that was ordered by her PCP.  Her pain has been present since Tuesday.  Pain is been constant.  She had an ultrasound completed yesterday and received a phone call from the PCP stating that her ultrasound is showing signs of cholecystitis, and she needed to come to the ER immediately.  She denies any fevers or chills.  Pain remains constant and there is nausea.  Past Medical History:  Diagnosis Date  . Anemia   . Anxiety   . Asthma   . Back pain   . Chest pain   . Depression   . Frequent headaches   . Joint pain   . Persistent headaches   . Shortness of breath     Patient Active Problem List   Diagnosis Date Noted  . Acute cholecystitis due to biliary calculus 12/13/2018  . Cholelithiasis with cholecystitis 12/13/2018  . Cholecystitis with cholelithiasis 12/13/2018  . Abdominal pain 07/23/2018  . Vitamin D deficiency 06/20/2017  . 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 Right-sided low back pain without sciatica 01/09/2017    Past Surgical History:  Procedure Laterality Date  . CORNEAL TRANSPLANT    . WISDOM TOOTH EXTRACTION       OB History    Gravida  0   Para  0   Term  0   Preterm  0   AB  0   Living  0     SAB  0   TAB  0   Ectopic  0   Multiple  0   Live Births  0            Home Medications    Prior to Admission medications   Medication Sig Start Date End Date Taking? Authorizing Provider  ibuprofen (ADVIL) 200 MG tablet Take 400  mg by mouth every 6 (six) hours as needed for moderate pain.   Yes [provider]  cyclobenzaprine (FLEXERIL) 10 MG tablet Take 1 tablet (10 mg total) by mouth 2 (two) times daily as needed for muscle spasms. 12/09/18   Eustaquio Maize, PA-C    Family History Family History  Problem Relation Age of Onset  . Arthritis Mother   . Hypertension Mother   . Cancer Mother        thyroid  . Thyroid disease Mother   . Obesity Mother   . Diabetes Maternal Uncle   . Arthritis Maternal Grandmother   . Hypertension Maternal Grandmother   . Heart disease Maternal Grandmother   . Diabetes Maternal Grandmother   . Cancer Maternal Grandmother        thyroid    Social History Social History   Tobacco Use  . Smoking status: Former Smoker    Packs/day: 12.00    Years: 2.00    Pack years: 24.00    Quit date: 2015    Years since quitting: 5.5  . Smokeless tobacco: Never Used  Substance Use Topics  .  Alcohol use: No  . Drug use: No     Allergies   Patient has no known allergies.   Review of Systems Review of Systems  Constitutional: Positive for activity change.  Gastrointestinal: Positive for abdominal pain and nausea.  Genitourinary: Negative for dysuria.  Allergic/Immunologic: Negative for immunocompromised state.  All other systems reviewed and are negative.    Physical Exam Updated Vital Signs BP 115/70 (BP Location: Right Arm)   Pulse 62   Temp 98.1 F (36.7 C)   Resp 16   Ht 5\' 5"  (1.651 m)   Wt 92.5 kg   LMP 11/18/2018 Comment: neg preg test  SpO2 97%   BMI 33.95 kg/m   Physical Exam Vitals signs and nursing note reviewed.  Constitutional:      Appearance: She is well-developed.  HENT:     Head: Normocephalic and atraumatic.  Eyes:     Pupils: Pupils are equal, round, and reactive to light.  Neck:     Musculoskeletal: Neck supple.  Cardiovascular:     Rate and Rhythm: Normal rate and regular rhythm.     Heart sounds: Normal heart sounds. No  murmur.  Pulmonary:     Effort: Pulmonary effort is normal. No respiratory distress.  Abdominal:     General: There is no distension.     Palpations: Abdomen is soft.     Tenderness: There is abdominal tenderness in the right upper quadrant. There is guarding. There is no rebound. Positive signs include Murphy's sign.  Skin:    General: Skin is warm and dry.  Neurological:     Mental Status: She is alert and oriented to person, place, and time.      ED Treatments / Results  Labs (all labs ordered are listed, but only abnormal results are displayed) Labs Reviewed  SURGICAL PCR SCREEN - Abnormal; Notable for the following components:      Result Value   Staphylococcus aureus POSITIVE (*)    All other components within normal limits  COMPREHENSIVE METABOLIC PANEL - Abnormal; Notable for the following components:   Total Protein 9.1 (*)    AST 61 (*)    ALT 299 (*)    Alkaline Phosphatase 148 (*)    All other components within normal limits  CBC WITH DIFFERENTIAL/PLATELET - Abnormal; Notable for the following components:   Platelets 466 (*)    All other components within normal limits  URINALYSIS, ROUTINE W REFLEX MICROSCOPIC - Abnormal; Notable for the following components:   Ketones, ur 5 (*)    Leukocytes,Ua TRACE (*)    Bacteria, UA RARE (*)    All other components within normal limits  SARS CORONAVIRUS 2 (HOSPITAL ORDER, PERFORMED IN [place] [facility])  PROTIME-INR  LIPASE, BLOOD  HIV ANTIBODY (ROUTINE TESTING W REFLEX)  I-STAT BETA HCG BLOOD, ED ([facility], [facility], [facility] ONLY)  SURGICAL PATHOLOGY    EKG None  Radiology Dg C-arm 1-60 Min-no Report  Result Date: 12/14/2018 Fluoroscopy was utilized by the requesting physician.  No radiographic interpretation.    Procedures Procedures (including critical care time)  Medications Ordered in ED Medications  dextrose 5 % and 0.45 % NaCl with KCl 20 mEq/L infusion ( Intravenous Stopped 12/14/18 1619)  cefTRIAXone (ROCEPHIN)  2 g in sodium chloride 0.9 % 100 mL IVPB (2 g Intravenous New Bag/Given 12/14/18 1619)  acetaminophen (TYLENOL) tablet 650 mg (650 mg Oral Given 12/14/18 1348)    Or  acetaminophen (TYLENOL) suppository 650 mg ( Rectal See Alternative 12/14/18  1348)  HYDROcodone-acetaminophen (NORCO/VICODIN) 5-325 MG per tablet 1-2 tablet (1 tablet Oral Given 12/14/18 1441)  HYDROmorphone (DILAUDID) injection 1 mg ( Intravenous MAR Unhold 12/14/18 1225)  ondansetron (ZOFRAN-ODT) disintegrating tablet 4 mg (4 mg Oral Given 12/14/18 1441)    Or  ondansetron (ZOFRAN) injection 4 mg ( Intravenous See Alternative 12/14/18 1441)  mupirocin ointment (BACTROBAN) 2 % 1 application ( Nasal MAR Unhold 12/14/18 1225)  scopolamine (TRANSDERM-SCOP) 1 MG/3DAYS 1.5 mg (1.5 mg Transdermal Patch Applied 12/14/18 0922)  celecoxib (CELEBREX) 200 MG capsule (has no administration in time range)  gabapentin (NEURONTIN) 300 MG capsule (has no administration in time range)  acetaminophen (TYLENOL) 500 MG tablet (has no administration in time range)  scopolamine (TRANSDERM-SCOP) 1 MG/3DAYS (has no administration in time range)  fentaNYL (SUBLIMAZE) 100 MCG/2ML injection (has no administration in time range)  cefTRIAXone (ROCEPHIN) 2 g in sodium chloride 0.9 % 100 mL IVPB (0 g Intravenous Stopped 12/13/18 1829)  ondansetron (ZOFRAN) injection 4 mg (4 mg Intravenous Given 12/13/18 1743)  morphine 4 MG/ML injection 4 mg (4 mg Intravenous Given 12/13/18 1746)  sodium chloride 0.9 % bolus 1,000 mL (0 mLs Intravenous Stopped 12/13/18 1829)  acetaminophen (TYLENOL) tablet 1,000 mg (1,000 mg Oral Given 12/14/18 0924)  gabapentin (NEURONTIN) capsule 300 mg (300 mg Oral Given 12/14/18 0924)  celecoxib (CELEBREX) capsule 200 mg (200 mg Oral Given 12/14/18 0925)  lactated ringers infusion (1,000 mLs  New Bag/Given 12/14/18 1004)  succinylcholine (ANECTINE) 200 MG/10ML syringe (has no administration in time range)  lidocaine 20 MG/ML injection (has no administration in time  range)  rocuronium bromide 100 MG/10ML SOSY (has no administration in time range)  ondansetron (ZOFRAN) 4 MG/2ML injection (has no administration in time range)  propofol (DIPRIVAN) 10 mg/mL bolus/IV push (has no administration in time range)  fentaNYL (SUBLIMAZE) 250 MCG/5ML injection (has no administration in time range)  midazolam (VERSED) 2 MG/2ML injection (has no administration in time range)  diphenhydrAMINE (BENADRYL) 50 MG/ML injection (has no administration in time range)     Initial Impression / Assessment and Plan / ED Course  I have reviewed the triage vital signs and the nursing notes.  Pertinent labs & imaging results that were available during my care of the patient were reviewed by me and considered in my medical decision making (see chart for details).         28 year old female comes in a chief complaint of abdominal pain.  She has right upper quadrant abdominal pain that is been going on for 3 straight days.  She had an ultrasound yesterday that confirms acute cholecystitis and she was advised to come to the ED today.  On exam she has positive Murphy's.  Antibiotics ordered.  Labs reassuring besides elevated LFTs.  General surgery to admit.  Final Clinical Impressions(s) / ED Diagnoses   Final diagnoses:  Cholelithiasis with cholecystitis    ED Discharge Orders    None       Derwood KaplanNanavati, Kary Sugrue, MD 12/14/18 1643

## 2018-12-14 NOTE — Anesthesia Preprocedure Evaluation (Signed)
Anesthesia Evaluation  Patient identified by MRN, date of birth, ID band Patient awake    Reviewed: Allergy & Precautions, NPO status , Patient's Chart, lab work & pertinent test results  Airway Mallampati: II  TM Distance: >3 FB Neck ROM: Full    Dental  (+) Teeth Intact, Dental Advisory Given   Pulmonary asthma , former smoker,    Pulmonary exam normal breath sounds clear to auscultation       Cardiovascular negative cardio ROS Normal cardiovascular exam Rhythm:Regular Rate:Normal     Neuro/Psych  Headaches, PSYCHIATRIC DISORDERS Anxiety Depression    GI/Hepatic negative GI ROS, CHOLELITHIASIS   Endo/Other  Obesity   Renal/GU negative Renal ROS     Musculoskeletal negative musculoskeletal ROS (+)   Abdominal   Peds  Hematology  (+) Blood dyscrasia, anemia ,   Anesthesia Other Findings Day of surgery medications reviewed with the patient.  Reproductive/Obstetrics                             Anesthesia Physical Anesthesia Plan  ASA: II  Anesthesia Plan: General   Post-op Pain Management:    Induction: Intravenous  PONV Risk Score and Plan: 4 or greater and Diphenhydramine, Scopolamine patch - Pre-op, Midazolam, Ondansetron and Dexamethasone  Airway Management Planned: Oral ETT  Additional Equipment:   Intra-op Plan:   Post-operative Plan: Extubation in OR  Informed Consent: I have reviewed the patients History and Physical, chart, labs and discussed the procedure including the risks, benefits and alternatives for the proposed anesthesia with the patient or authorized representative who has indicated his/her understanding and acceptance.     Dental advisory given  Plan Discussed with: CRNA  Anesthesia Plan Comments:         Anesthesia Quick Evaluation

## 2018-12-14 NOTE — Transfer of Care (Signed)
Immediate Anesthesia Transfer of Care Note  Patient: Traci Finley  Procedure(s) Performed: LAPAROSCOPIC CHOLECYSTECTOMY WITH INTRAOPERATIVE CHOLANGIOGRAM (N/A Abdomen)  Patient Location: PACU  Anesthesia Type:General  Level of Consciousness: drowsy and patient cooperative  Airway & Oxygen Therapy: Patient Spontanous Breathing and Patient connected to face mask oxygen  Post-op Assessment: Report given to RN and Post -op Vital signs reviewed and stable  Post vital signs: Reviewed and stable  Last Vitals:  Vitals Value Taken Time  BP 126/67 12/14/18 1100  Temp 36.9 C 12/14/18 1100  Pulse 79 12/14/18 1101  Resp 15 12/14/18 1101  SpO2 100 % 12/14/18 1101  Vitals shown include unvalidated device data.  Last Pain:  Vitals:   12/14/18 0741  TempSrc:   PainSc: 0-No pain      Patients Stated Pain Goal: 4 (57/26/20 3559)  Complications: No apparent anesthesia complications

## 2018-12-14 NOTE — Plan of Care (Signed)
  Problem: Coping: Goal: Level of anxiety will decrease Outcome: Progressing   Problem: Elimination: Goal: Will not experience complications related to bowel motility Outcome: Progressing   Problem: Clinical Measurements: Goal: Cardiovascular complication will be avoided Outcome: Progressing   Problem: Activity: Goal: Risk for activity intolerance will decrease Outcome: Progressing

## 2018-12-15 ENCOUNTER — Encounter (HOSPITAL_COMMUNITY): Payer: Self-pay | Admitting: Surgery

## 2018-12-15 LAB — HIV ANTIBODY (ROUTINE TESTING W REFLEX): HIV Screen 4th Generation wRfx: NONREACTIVE

## 2018-12-15 MED ORDER — TRAMADOL HCL 50 MG PO TABS: 50.0000 mg | ORAL_TABLET | Freq: Four times a day (QID) | ORAL | 0 refills | Status: DC | PRN

## 2018-12-15 MED ORDER — TRAMADOL HCL 50 MG PO TABS
50.0000 mg | ORAL_TABLET | Freq: Four times a day (QID) | ORAL | Status: DC | PRN
  Administered 2018-12-15: 50 mg via ORAL
  Filled 2018-12-15: qty 1

## 2018-12-15 NOTE — Discharge Summary (Signed)
Physician Discharge Summary [facility]  Patient ID: Traci Finley MRN: [id number] DOB/AGE: Feb 16, 1991 27 y.o.  Admit date: 12/13/2018 Discharge date: 12/15/2018  Admission Diagnoses:  Cholecystitis, cholelithiasis, unrelenting biliary colic  Discharge Diagnoses:  Principal Problem:   Cholelithiasis with cholecystitis Active Problems:   Cholecystitis with cholelithiasis   Discharged Condition: good  Hospital Course: Patient was admitted for observation following gallbladder surgery.  Post op course was uncomplicated.  Pain was well controlled.  Tolerated diet.  Patient was prepared for discharge home on POD#1.  Consults: None  Treatments: surgery: lap chole with IOC  Discharge Exam: Blood pressure 132/71, pulse 61, temperature 98.3 F (36.8 C), temperature source Oral, resp. rate 18, height 5\' 5"  (1.651 m), weight 92.5 kg, last menstrual period 11/18/2018, SpO2 98 %. HEENT - clear Neck - soft Chest - clear bilaterally Cor - RRR Abd - soft without distension; wounds dry and intact with Dermabond  Disposition: Home  Discharge Instructions    Diet - low sodium heart healthy   Complete by: As directed    Discharge instructions   Complete by: As directed    [facility]  LAPAROSCOPIC SURGERY:  POST-OP INSTRUCTIONS  Always review your discharge instruction sheet given to you by the facility where your surgery was performed.  A prescription for pain medication may be given to you upon discharge.  Take your pain medication as prescribed.  If narcotic pain medicine is not needed, then you may take acetaminophen (Tylenol) or ibuprofen (Advil) as needed.  Take your usually prescribed medications unless otherwise directed.  If you need a refill on your pain medication, please contact your pharmacy.  They will contact our office to request authorization. Prescriptions will not be filled after 5 P.M. or on weekends.  You should follow a  light diet the first few days after arrival home, such as soup and crackers or toast.  Be sure to include plenty of fluids daily.  Most patients will experience some swelling and bruising in the area of the incisions.  Ice packs will help.  Swelling and bruising can take several days to resolve.   It is common to experience some constipation after surgery.  Increasing fluid intake and taking a stool softener (such as Colace) will usually help or prevent this problem from occurring.  A mild laxative (Milk of Magnesia or Miralax) should be taken according to package instructions if there has been no bowel movement after 48 hours.  You will likely have Dermabond (topical glue) over your incisions.  This seals the incisions and allows you to bathe and shower at any time after your surgery.  Glue should remain in place for up to 10 days.  It may be removed after 10 days by pealing off the Dermabond material or using Vaseline or naval jelly to remove.  If you have steri-strips over your incisions, you may remove the gauze bandage on the second day after surgery, and you may shower at that time.  Leave your steri-strips (small skin tapes) in place directly over the incision.  These strips should remain on the skin for 5-7 days and then be removed.  You may get them wet in the shower and pat them dry.  Any sutures or staples will be removed at the office during your follow-up visit.  ACTIVITIES:  You may resume regular (light) daily activities beginning the next day - such as daily self-care, walking, climbing stairs - gradually increasing activities as tolerated.  You may have sexual intercourse when it is comfortable.  Refrain from any heavy lifting or straining until approved by your doctor.  You may drive when you are no longer taking prescription pain medication, when you can comfortably wear a seatbelt, and when you can safely maneuver your car and apply brakes.  You should see your doctor in the  office for a follow-up appointment approximately 2-3 weeks after your surgery.  Make sure that you call for this appointment within a day or two after you arrive home to insure a convenient appointment time.  WHEN TO CALL YOUR DOCTOR: Fever over 101.0 Inability to urinate Continued bleeding from incision Increased pain, redness, or drainage from the incision Increasing abdominal pain  The clinic staff is available to answer your questions during regular business hours.  Please don't hesitate to call and ask to speak to one of the nurses for clinical concerns.  If you have a medical emergency, go to the nearest emergency room or call 911.  A surgeon from [facility] is always on call for the hospital.  Earnstine Regal, MD, [facility] Office: [place] Free:  [place] [phone]  Website: [email or web address]   Increase activity slowly   Complete by: As directed    No dressing needed   Complete by: As directed      Allergies as of 12/15/2018   No Known Allergies     Medication List    TAKE these medications   cyclobenzaprine 10 MG tablet Commonly known as: FLEXERIL Take 1 tablet (10 mg total) by mouth 2 (two) times daily as needed for muscle spasms.   ibuprofen 200 MG tablet Commonly known as: ADVIL Take 400 mg by mouth every 6 (six) hours as needed for moderate pain.   traMADol 50 MG tablet Commonly known as: ULTRAM Take 1-2 tablets (50-100 mg total) by mouth every 6 (six) hours as needed.      Follow-up [facility], [place]. Schedule an appointment as soon as possible for a visit in 2 week(s).   Specialty: General Surgery Contact information: [address] [place], MD, [facility] Office: [phone]   Signed: Armandina Gemma 12/15/2018, 8:21 AM

## 2018-12-15 NOTE — Plan of Care (Signed)
  Problem: Clinical Measurements: Goal: Cardiovascular complication will be avoided Outcome: Progressing   Problem: Activity: Goal: Risk for activity intolerance will decrease Outcome: Progressing   Problem: Nutrition: Goal: Adequate nutrition will be maintained Outcome: Progressing   Problem: Elimination: Goal: Will not experience complications related to bowel motility Outcome: Progressing   

## 2018-12-15 NOTE — Progress Notes (Signed)
Pt stable at time of d/c. No needs at time of d/c. No questions or complications at this time.

## 2019-12-26 ENCOUNTER — Ambulatory Visit (INDEPENDENT_AMBULATORY_CARE_PROVIDER_SITE_OTHER): Payer: 59 | Admitting: Internal Medicine

## 2019-12-26 ENCOUNTER — Encounter: Payer: Self-pay | Admitting: Internal Medicine

## 2019-12-26 ENCOUNTER — Other Ambulatory Visit: Payer: Self-pay

## 2019-12-26 VITALS — BP 126/84 | HR 83 | Temp 98.2°F | Ht 65.0 in | Wt 179.0 lb

## 2019-12-26 DIAGNOSIS — E8881 Metabolic syndrome: Secondary | ICD-10-CM

## 2019-12-26 DIAGNOSIS — Z0000 Encounter for general adult medical examination without abnormal findings: Secondary | ICD-10-CM | POA: Diagnosis not present

## 2019-12-26 DIAGNOSIS — E559 Vitamin D deficiency, unspecified: Secondary | ICD-10-CM

## 2019-12-26 NOTE — Progress Notes (Signed)
   Subjective:   Patient ID: Traci Finley, female    DOB: 1990/08/04, 29 y.o.   MRN: [id number]  HPI The patient is a 29 YO female coming in for physical.   PMH, FMH, social history reviewed and updated  Review of Systems  Constitutional: Negative.   HENT: Negative.   Eyes: Negative.   Respiratory: Negative for cough, chest tightness and shortness of breath.   Cardiovascular: Negative for chest pain, palpitations and leg swelling.  Gastrointestinal: Negative for abdominal distention, abdominal pain, constipation, diarrhea, nausea and vomiting.  Musculoskeletal: Negative.   Skin: Negative.   Neurological: Negative.   Psychiatric/Behavioral: Negative.     Objective:  Physical Exam Constitutional:      Appearance: She is well-developed.  HENT:     Head: Normocephalic and atraumatic.  Cardiovascular:     Rate and Rhythm: Normal rate and regular rhythm.  Pulmonary:     Effort: Pulmonary effort is normal. No respiratory distress.     Breath sounds: Normal breath sounds. No wheezing or rales.  Abdominal:     General: Bowel sounds are normal. There is no distension.     Palpations: Abdomen is soft.     Tenderness: There is no abdominal tenderness. There is no rebound.  Musculoskeletal:     Cervical back: Normal range of motion.  Skin:    General: Skin is warm and dry.  Neurological:     Mental Status: She is alert and oriented to person, place, and time.     Coordination: Coordination normal.     Vitals:   12/26/19 1445  BP: 126/84  Pulse: 83  Temp: 98.2 F (36.8 C)  TempSrc: Oral  SpO2: 99%  Weight: 179 lb (81.2 kg)  Height: 5\' 5"  (1.651 m)   This visit occurred during the SARS-CoV-2 public health emergency.  Safety protocols were in place, including screening questions prior to the visit, additional usage of staff PPE, and extensive cleaning of exam room while observing appropriate contact time as indicated for disinfecting solutions.   Assessment & Plan:

## 2019-12-26 NOTE — Assessment & Plan Note (Signed)
Flu shot yearly. Covid-19 counseled. Tetanus up to date. Pap smear up to date. Counseled about sun safety and mole surveillance. Counseled about the dangers of distracted driving. Given 10 year screening recommendations.   

## 2019-12-26 NOTE — Patient Instructions (Addendum)
You can try turmeric and glucosamine for the knees which have both been shown to add benefit.  Plantar Fasciitis Rehab Ask your health care provider which exercises are safe for you. Do exercises exactly as told by your health care provider and adjust them as directed. It is normal to feel mild stretching, pulling, tightness, or discomfort as you do these exercises. Stop right away if you feel sudden pain or your pain gets worse. Do not begin these exercises until told by your health care provider. Stretching and range-of-motion exercises These exercises warm up your muscles and joints and improve the movement and flexibility of your foot. These exercises also help to relieve pain. Plantar fascia stretch  1. Sit with your left / right leg crossed over your opposite knee. 2. Hold your heel with one hand with that thumb near your arch. With your other hand, hold your toes and gently pull them back toward the top of your foot. You should feel a stretch on the bottom of your toes or your foot (plantar fascia) or both. 3. Hold this stretch for__________ seconds. 4. Slowly release your toes and return to the starting position. Repeat __________ times. Complete this exercise __________ times a day. Gastrocnemius stretch, standing This exercise is also called a calf (gastroc) stretch. It stretches the muscles in the back of the upper calf. 1. Stand with your hands against a wall. 2. Extend your left / right leg behind you, and bend your front knee slightly. 3. Keeping your heels on the floor and your back knee straight, shift your weight toward the wall. Do not arch your back. You should feel a gentle stretch in your upper left / right calf. 4. Hold this position for __________ seconds. Repeat __________ times. Complete this exercise __________ times a day. Soleus stretch, standing This exercise is also called a calf (soleus) stretch. It stretches the muscles in the back of the lower calf. 1. Stand  with your hands against a wall. 2. Extend your left / right leg behind you, and bend your front knee slightly. 3. Keeping your heels on the floor, bend your back knee and shift your weight slightly over your back leg. You should feel a gentle stretch deep in your lower calf. 4. Hold this position for __________ seconds. Repeat __________ times. Complete this exercise __________ times a day. Gastroc and soleus stretch, standing step This exercise stretches the muscles in the back of the lower leg. These muscles are in the upper calf (gastrocnemius) and the lower calf (soleus). 1. Stand with the ball of your left / right foot on a step. The ball of your foot is on the walking surface, right under your toes. 2. Keep your other foot firmly on the same step. 3. Hold on to the wall or a railing for balance. 4. Slowly lift your other foot, allowing your body weight to press your left / right heel down over the edge of the step. You should feel a stretch in your left / right calf. 5. Hold this position for __________ seconds. 6. Return both feet to the step. 7. Repeat this exercise with a slight bend in your left / right knee. Repeat __________ times with your left / right knee straight and __________ times with your left / right knee bent. Complete this exercise __________ times a day. Balance exercise This exercise builds your balance and strength control of your arch to help take pressure off your plantar fascia. Single leg stand If this exercise  is too easy, you can try it with your eyes closed or while standing on a pillow. 1. Without shoes, stand near a railing or in a doorway. You may hold on to the railing or door frame as needed. 2. Stand on your left / right foot. Keep your big toe down on the floor and try to keep your arch lifted. Do not let your foot roll inward. 3. Hold this position for __________ seconds. Repeat __________ times. Complete this exercise __________ times a day. This  information is not intended to replace advice given to you by your health care provider. Make sure you discuss any questions you have with your health care provider. Document Revised: 08/22/2018 Document Reviewed: 02/27/2018 Elsevier Patient Education  2020 [employer].  Health Maintenance, Female Adopting a healthy lifestyle and getting preventive care are important in promoting health and wellness. Ask your health care provider about:  The right schedule for you to have regular tests and exams.  Things you can do on your own to prevent diseases and keep yourself healthy. What should I know about diet, weight, and exercise? Eat a healthy diet   Eat a diet that includes plenty of vegetables, fruits, low-fat dairy products, and lean protein.  Do not eat a lot of foods that are high in solid fats, added sugars, or sodium. Maintain a healthy weight Body mass index (BMI) is used to identify weight problems. It estimates body fat based on height and weight. Your health care provider can help determine your BMI and help you achieve or maintain a healthy weight. Get regular exercise Get regular exercise. This is one of the most important things you can do for your health. Most adults should:  Exercise for at least 150 minutes each week. The exercise should increase your heart rate and make you sweat (moderate-intensity exercise).  Do strengthening exercises at least twice a week. This is in addition to the moderate-intensity exercise.  Spend less time sitting. Even light physical activity can be beneficial. Watch cholesterol and blood lipids Have your blood tested for lipids and cholesterol at 31 years of age, then have this test every 5 years. Have your cholesterol levels checked more often if:  Your lipid or cholesterol levels are high.  You are older than 31 years of age.  You are at high risk for heart disease. What should I know about cancer screening? Depending on your health  history and family history, you may need to have cancer screening at various ages. This may include screening for:  Breast cancer.  Cervical cancer.  Colorectal cancer.  Skin cancer.  Lung cancer. What should I know about heart disease, diabetes, and high blood pressure? Blood pressure and heart disease  High blood pressure causes heart disease and increases the risk of stroke. This is more likely to develop in people who have high blood pressure readings, are of African descent, or are overweight.  Have your blood pressure checked: ? Every 3-5 years if you are 47-38 years of age. ? Every year if you are 80 years old or older. Diabetes Have regular diabetes screenings. This checks your fasting blood sugar level. Have the screening done:  Once every three years after age 64 if you are at a normal weight and have a low risk for diabetes.  More often and at a younger age if you are overweight or have a high risk for diabetes. What should I know about preventing infection? Hepatitis B If you have a higher  risk for hepatitis B, you should be screened for this virus. Talk with your health care provider to find out if you are at risk for hepatitis B infection. Hepatitis C Testing is recommended for:  Everyone born from [birth year] through 1967.  Anyone with known risk factors for hepatitis C. Sexually transmitted infections (STIs)  Get screened for STIs, including gonorrhea and chlamydia, if: ? You are sexually active and are younger than 32 years of age. ? You are older than 31 years of age and your health care provider tells you that you are at risk for this type of infection. ? Your sexual activity has changed since you were last screened, and you are at increased risk for chlamydia or gonorrhea. Ask your health care provider if you are at risk.  Ask your health care provider about whether you are at high risk for HIV. Your health care provider may recommend a prescription medicine to  help prevent HIV infection. If you choose to take medicine to prevent HIV, you should first get tested for HIV. You should then be tested every 3 months for as long as you are taking the medicine. Pregnancy  If you are about to stop having your period (premenopausal) and you may become pregnant, seek counseling before you get pregnant.  Take 400 to 800 micrograms (mcg) of folic acid every day if you become pregnant.  Ask for birth control (contraception) if you want to prevent pregnancy. Osteoporosis and menopause Osteoporosis is a disease in which the bones lose minerals and strength with aging. This can result in bone fractures. If you are 21 years old or older, or if you are at risk for osteoporosis and fractures, ask your health care provider if you should:  Be screened for bone loss.  Take a calcium or vitamin D supplement to lower your risk of fractures.  Be given hormone replacement therapy (HRT) to treat symptoms of menopause. Follow these instructions at home: Lifestyle  Do not use any products that contain nicotine or tobacco, such as cigarettes, e-cigarettes, and chewing tobacco. If you need help quitting, ask your health care provider.  Do not use street drugs.  Do not share needles.  Ask your health care provider for help if you need support or information about quitting drugs. Alcohol use  Do not drink alcohol if: ? Your health care provider tells you not to drink. ? You are pregnant, may be pregnant, or are planning to become pregnant.  If you drink alcohol: ? Limit how much you use to 0-1 drink a day. ? Limit intake if you are breastfeeding.  Be aware of how much alcohol is in your drink. In the [place] one drink equals one 12 oz bottle of beer (355 mL), one 5 oz glass of wine (148 mL), or one 1 oz glass of hard liquor (44 mL). General instructions  Schedule regular health, dental, and eye exams.  Stay current with your vaccines.  Tell your health care  provider if: ? You often feel depressed. ? You have ever been abused or do not feel safe at home. Summary  Adopting a healthy lifestyle and getting preventive care are important in promoting health and wellness.  Follow your health care provider's instructions about healthy diet, exercising, and getting tested or screened for diseases.  Follow your health care provider's instructions on monitoring your cholesterol and blood pressure. This information is not intended to replace advice given to you by your health care provider. Make sure you discuss  any questions you have with your health care provider. Document Revised: 04/24/2018 Document Reviewed: 04/24/2018 Elsevier Patient Education  2020 [employer].

## 2019-12-26 NOTE — Assessment & Plan Note (Signed)
Checking vitamin D level and adjust as needed.  

## 2019-12-27 LAB — COMPREHENSIVE METABOLIC PANEL
ALT: 8 IU/L (ref 0–32)
AST: 12 IU/L (ref 0–40)
Albumin/Globulin Ratio: 1.5 (ref 1.2–2.2)
Albumin: 4.3 g/dL (ref 3.9–5.0)
Alkaline Phosphatase: 68 IU/L (ref 48–121)
BUN/Creatinine Ratio: 21 (ref 9–23)
BUN: 15 mg/dL (ref 6–20)
Bilirubin Total: 0.3 mg/dL (ref 0.0–1.2)
CO2: 25 mmol/L (ref 20–29)
Calcium: 8.9 mg/dL (ref 8.7–10.2)
Chloride: 103 mmol/L (ref 96–106)
Creatinine, Ser: 0.72 mg/dL (ref 0.57–1.00)
GFR calc Af Amer: 130 mL/min/{1.73_m2} (ref >59)
GFR calc non Af Amer: 113 mL/min/{1.73_m2} (ref >59)
Globulin, Total: 2.8 g/dL (ref 1.5–4.5)
Glucose: 81 mg/dL (ref 65–99)
Potassium: 4.5 mmol/L (ref 3.5–5.2)
Sodium: 138 mmol/L (ref 134–144)
Total Protein: 7.1 g/dL (ref 6.0–8.5)

## 2019-12-27 LAB — VITAMIN D 25 HYDROXY (VIT D DEFICIENCY, FRACTURES): Vit D, 25-Hydroxy: 27.6 ng/mL — ABNORMAL LOW (ref 30.0–100.0)

## 2019-12-27 LAB — CBC WITH DIFFERENTIAL/PLATELET
Basophils Absolute: 0 10*3/uL (ref 0.0–0.2)
Basos: 1 %
EOS (ABSOLUTE): 0.1 10*3/uL (ref 0.0–0.4)
Eos: 1 %
Hematocrit: 32.6 % — ABNORMAL LOW (ref 34.0–46.6)
Hemoglobin: 10.1 g/dL — ABNORMAL LOW (ref 11.1–15.9)
Immature Grans (Abs): 0 10*3/uL (ref 0.0–0.1)
Immature Granulocytes: 1 %
Lymphocytes Absolute: 1.9 10*3/uL (ref 0.7–3.1)
Lymphs: 43 %
MCH: 27.3 pg (ref 26.6–33.0)
MCHC: 31 g/dL — ABNORMAL LOW (ref 31.5–35.7)
MCV: 88 fL (ref 79–97)
Monocytes Absolute: 0.5 10*3/uL (ref 0.1–0.9)
Monocytes: 11 %
Neutrophils Absolute: 1.9 10*3/uL (ref 1.4–7.0)
Neutrophils: 43 %
Platelets: 390 10*3/uL (ref 150–450)
RBC: 3.7 x10E6/uL — ABNORMAL LOW (ref 3.77–5.28)
RDW: 13.5 % (ref 11.7–15.4)
WBC: 4.4 10*3/uL (ref 3.4–10.8)

## 2019-12-27 LAB — TSH: TSH: 1.91 u[IU]/mL (ref 0.450–4.500)

## 2019-12-27 LAB — LIPID PANEL
Chol/HDL Ratio: 2.9 ratio (ref 0.0–4.4)
Cholesterol, Total: 116 mg/dL (ref 100–199)
HDL: 40 mg/dL (ref >39)
LDL Chol Calc (NIH): 64 mg/dL (ref 0–99)
Triglycerides: 53 mg/dL (ref 0–149)
VLDL Cholesterol Cal: 12 mg/dL (ref 5–40)

## 2019-12-27 LAB — SPECIMEN STATUS REPORT

## 2019-12-27 LAB — HEMOGLOBIN A1C
Est. average glucose Bld gHb Est-mCnc: 97 mg/dL
Hgb A1c MFr Bld: 5 % (ref 4.8–5.6)

## 2020-02-25 ENCOUNTER — Ambulatory Visit (INDEPENDENT_AMBULATORY_CARE_PROVIDER_SITE_OTHER): Payer: 59 | Admitting: Family

## 2020-02-25 ENCOUNTER — Other Ambulatory Visit: Payer: Self-pay

## 2020-02-25 ENCOUNTER — Encounter: Payer: Self-pay | Admitting: Family

## 2020-02-25 VITALS — BP 110/70 | HR 75 | Temp 98.5°F | Ht 65.0 in | Wt 179.2 lb

## 2020-02-25 DIAGNOSIS — E049 Nontoxic goiter, unspecified: Secondary | ICD-10-CM

## 2020-02-25 DIAGNOSIS — M79672 Pain in left foot: Secondary | ICD-10-CM

## 2020-02-25 DIAGNOSIS — M79671 Pain in right foot: Secondary | ICD-10-CM | POA: Diagnosis not present

## 2020-02-25 DIAGNOSIS — M791 Myalgia, unspecified site: Secondary | ICD-10-CM

## 2020-02-25 DIAGNOSIS — D649 Anemia, unspecified: Secondary | ICD-10-CM

## 2020-02-25 NOTE — Progress Notes (Signed)
Traci Finley is a 29 y.o. female with the following history as recorded in [employer]:  Patient Active Problem List   Diagnosis Date Noted  . Vitamin D deficiency 06/20/2017  . 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 Routine general medical examination at a health care facility 11/04/2015    No current outpatient medications on file.   No current facility-administered medications for this visit.    Allergies: Patient has no known allergies.  Past Medical History:  Diagnosis Date  . Anemia   . Anxiety   . Asthma   . Back pain   . Chest pain   . Depression   . Frequent headaches   . Joint pain   . Persistent headaches   . Shortness of breath     Past Surgical History:  Procedure Laterality Date  . CHOLECYSTECTOMY N/A 12/14/2018   Procedure: LAPAROSCOPIC CHOLECYSTECTOMY WITH INTRAOPERATIVE CHOLANGIOGRAM;  Surgeon: Darnell Level, MD;  Location: [facility];  Service: General;  Laterality: N/A;  . CORNEAL TRANSPLANT    . WISDOM TOOTH EXTRACTION      Family History  Problem Relation Age of Onset  . Arthritis Mother   . Hypertension Mother   . Cancer Mother        thyroid  . Thyroid disease Mother   . Obesity Mother   . Diabetes Maternal Uncle   . Arthritis Maternal Grandmother   . Hypertension Maternal Grandmother   . Heart disease Maternal Grandmother   . Diabetes Maternal Grandmother   . Cancer Maternal Grandmother        thyroid    Social History   Tobacco Use  . Smoking status: Former Smoker    Packs/day: 12.00    Years: 2.00    Pack years: 24.00    Quit date: 2015    Years since quitting: 6.7  . Smokeless tobacco: Never Used  Substance Use Topics  . Alcohol use: No    Subjective:  Concerned that underlying health issues have been missed- wants to get full thyroid work up due to FH of thyroid disease ( mother and MGM); is concerned that thyroid is enlarged;  History of anemia- notes her periods "are very bad"- last hemoglobin was at 10; did not understand to take iron  supplement;   Objective:  Vitals:   02/25/20 0946  BP: 110/70  Pulse: 75  Temp: 98.5 F (36.9 C)  TempSrc: Oral  SpO2: 99%  Weight: 179 lb 3.2 oz (81.3 kg)  Height: 5\' 5"  (1.651 m)    General: Well developed, well nourished, in no acute distress  Head: Normocephalic and atraumatic  Eyes: Sclera and conjunctiva clear; pupils round and reactive to light; extraocular movements intact  Ears: External normal; canals clear; tympanic membranes normal  Oropharynx: Pink, supple. No suspicious lesions  Neck: Supple with thyromegaly, no adenopathy  Lungs: Respirations unlabored;  Musculoskeletal: No deformities; no active joint inflammation  Extremities: No edema, cyanosis, clubbing  Vessels: Symmetric bilaterally  Neurologic: Alert and oriented; speech intact; face symmetrical; moves all extremities well; CNII-XII intact without focal deficit   Assessment:  1. Anemia, unspecified type   2. Enlarged thyroid   3. Myalgia   4. Pain in both feet     Plan:  1. Check CBC, ferritin today; may need iron infusions; needs to discuss her periods with her GYN to help limit anemia; 2. Update thyroid ultrasound; full thyroid panel done; 3. Check ANA, sed rate, RF; may need to see rheumatology; 4. Refer to podiatrist;  This visit occurred  during the SARS-CoV-2 public health emergency.  Safety protocols were in place, including screening questions prior to the visit, additional usage of staff PPE, and extensive cleaning of exam room while observing appropriate contact time as indicated for disinfecting solutions.     No follow-ups on file.  Orders Placed This Encounter  Procedures  . US THYROID    Standing Status:   Future    Standing Expiration Date:   02/24/2021    Order Specific Question:   Reason for Exam (SYMPTOM  OR DIAGNOSIS REQUIRED)    Answer:   enlarged thyroid    Order Specific Question:   Preferred imaging location?    Answer:   [facility]  . CBC with  Differential/Platelet    Standing Status:   Future    Number of Occurrences:   1    Standing Expiration Date:   02/24/2021  . Ferritin    Standing Status:   Future    Number of Occurrences:   1    Standing Expiration Date:   02/24/2021  . Thyroid Panel With TSH    Standing Status:   Future    Number of Occurrences:   1    Standing Expiration Date:   02/24/2021  . Antinuclear Antib (ANA)    Standing Status:   Future    Number of Occurrences:   1    Standing Expiration Date:   02/24/2021  . Sedimentation rate    Standing Status:   Future    Number of Occurrences:   1    Standing Expiration Date:   02/24/2021  . Rheumatoid Factor    Standing Status:   Future    Number of Occurrences:   1    Standing Expiration Date:   02/24/2021  . CBC with Differential/Platelet    Standing Status:   Future    Number of Occurrences:   1    Standing Expiration Date:   02/24/2021  . Ferritin    Standing Status:   Future    Number of Occurrences:   1    Standing Expiration Date:   02/24/2021  . Sedimentation rate    Standing Status:   Future    Number of Occurrences:   1    Standing Expiration Date:   02/24/2021  . Thyroid Panel With TSH    Standing Status:   Future    Number of Occurrences:   1    Standing Expiration Date:   02/24/2021  . Antinuclear Antib (ANA)    Standing Status:   Future    Number of Occurrences:   1    Standing Expiration Date:   02/24/2021  . Rheumatoid Factor    Standing Status:   Future    Number of Occurrences:   1    Standing Expiration Date:   02/24/2021  . Ambulatory referral to Podiatry    Referral Priority:   Routine    Referral Type:   Consultation    Referral Reason:   Specialty Services Required    Requested Specialty:   Podiatry    Number of Visits Requested:   1    Requested Prescriptions    No prescriptions requested or ordered in this encounter

## 2020-02-26 ENCOUNTER — Encounter: Payer: 59 | Attending: Internal Medicine | Admitting: Dietician

## 2020-02-26 ENCOUNTER — Encounter: Payer: Self-pay | Admitting: Dietician

## 2020-02-26 DIAGNOSIS — E8881 Metabolic syndrome: Secondary | ICD-10-CM | POA: Insufficient documentation

## 2020-02-26 LAB — CBC WITH DIFFERENTIAL/PLATELET
Basophils Absolute: 0 10*3/uL (ref 0.0–0.2)
Basos: 1 %
EOS (ABSOLUTE): 0.1 10*3/uL (ref 0.0–0.4)
Eos: 2 %
Hematocrit: 33.3 % — ABNORMAL LOW (ref 34.0–46.6)
Hemoglobin: 11.1 g/dL (ref 11.1–15.9)
Immature Grans (Abs): 0 10*3/uL (ref 0.0–0.1)
Immature Granulocytes: 0 %
Lymphocytes Absolute: 1.3 10*3/uL (ref 0.7–3.1)
Lymphs: 37 %
MCH: 28.6 pg (ref 26.6–33.0)
MCHC: 33.3 g/dL (ref 31.5–35.7)
MCV: 86 fL (ref 79–97)
Monocytes Absolute: 0.6 10*3/uL (ref 0.1–0.9)
Monocytes: 17 %
Neutrophils Absolute: 1.5 10*3/uL (ref 1.4–7.0)
Neutrophils: 43 %
Platelets: 380 10*3/uL (ref 150–450)
RBC: 3.88 x10E6/uL (ref 3.77–5.28)
RDW: 14 % (ref 11.7–15.4)
WBC: 3.5 10*3/uL (ref 3.4–10.8)

## 2020-02-26 LAB — RHEUMATOID FACTOR: Rheumatoid fact SerPl-aCnc: <10 IU/mL (ref 0.0–13.9)

## 2020-02-26 LAB — THYROID PANEL WITH TSH
Free Thyroxine Index: 1.9 (ref 1.2–4.9)
T3 Uptake Ratio: 28 % (ref 24–39)
T4, Total: 6.9 ug/dL (ref 4.5–12.0)
TSH: 2.01 u[IU]/mL (ref 0.450–4.500)

## 2020-02-26 LAB — SEDIMENTATION RATE: Sed Rate: 25 mm/hr (ref 0–32)

## 2020-02-26 LAB — ANA: Anti Nuclear Antibody (ANA): NEGATIVE

## 2020-02-26 LAB — FERRITIN: Ferritin: 11 ng/mL — ABNORMAL LOW (ref 15–150)

## 2020-02-26 NOTE — Progress Notes (Addendum)
Medical Nutrition Therapy:  Appt start time: 1045 end time:  1045.   Assessment:  Primary concerns today:  Patient is here today alone.  She would like to learn more to guide her on nutrition on whole nutritional foods to help her with her health issues and continue to lose weight.  Increased bloating.  Notes bloating is worse with sugar.  Complains of constipation (q 3-4 days). States that she feels that she is not having a good relationship with food.  Used to binge (eat a whole bag of chips) when she was younger. She was referred for insulin resistance. She states that a whole food diet is her choices and verbalized that plant based eating is ideal but is unsure if she can do this. Uses My Fitness Pal to count calories.  History includes Insulin resistance, anemia, very heavy periods, thyroid concerns for patient, increased pain in legs and obtaining a work up, vitamin D deficiency A1C 5%, Vitamin D 27.6, Hemoglobin 10.5%  Weight hx: 175 lbs 12/27/19 Greater than 240 lbs 2 years ago  Body Composition Scale Feb 26 2020  Current Body Weight 175.1 lbs  Total Body Fat % 32.5  Visceral Fat 7  Fat-Free Mass % 67.4   Total Body Water % 48.2  Muscle-Mass lbs 33.3  BMI 28.8  Body Fat Displacement          Torso  lbs 35.2         Left Leg  lbs 7         Right Leg  lbs 7         Left Arm  lbs 3.5         Right Arm   lbs 3.5     Patient lives with her girlfriend.  They have an adopted 87 year old. They share shopping and Nakaila does the cooking.  She used to see Dr. Malena Edman but wanted a more personal whole foods approach. Her girlfriend has a lot of health issues and is overweight and chooses many unhealthy choices. She works for [employer] from home.  Preferred Learning Style:   No preference indicated   Learning Readiness:   Ready  Change in progress   MEDICATIONS: see list to include vitamin d (1,000 units daily), turmeric    DIETARY INTAKE: Was meal prepping on Sunday's  and Wednesdays Avoided foods include Dairy except for ice cream.   Aims to avoid snacks but increased snacks during period.  24-hr recall:  B ( AM): Quick Oatmeal, PB2, banana OR eggs (1 whole, 2 whites), 1 slice 45 calorie bread, cream of rice, [place] bacon Snk ( AM): none  L ( PM): lean ground beef or ground [place] or shrimp, rice, vegetables OR chicken breast, chick peas, vegetables Snk ( PM): Halo top ice cream, sugar free candy containing alulose, fiber 1 bar or protein bar D ( PM): similar to lunch Snk ( PM): Halo top ice cream, sugar free candy containing alulose, fiber 1 bar Beverages: water, occasional sugar free flavor packets  Usual physical activity: on days she feels well (jump ropes, weights, bike)  Currently none due to pain and increased fatigue.  Estimated energy needs: 1400 calories 70 g protein  Progress Towards Goal(s):  In progress.   Nutritional Diagnosis:  NB-1.1 Food and nutrition-related knowledge deficit As related to balance of carbohydrates, protein, carbohydrate and other nutrients.  As evidenced by diet hx and patient history.    Intervention:  Nutrition counseling/education related to balance of nutrition.  Discussed variety of choices to obtain all of the needed nutrients.  Discussed nutrition related to anemia as well as causes.  Discussed habit changes that she has already made.    Importance of fiber. Discussed importance of listening to her body.  Nourish when hungry, stop when satisfied or full.  Rest when tired. Discussed plant based eating due to it's benefit to decrease inflammation and benefits for insulin resistance.   Provided resources for plant based eating.  Discussed simple meals and resources.  Plan: Listen to your body!  Consider increasing your vitamin D to 2.000-4,000 units daily. Consider taking a daily multivitamin. Consider a 2 week trial of no dairy and decreased sugar. Choose foods that have more iron.  Consider a  supplement.  Now iron is labeled non constipating.  Continue to discuses causes with your MD.  Plant based resource: Nutrition Facts.org  Dr. Parke Poisson daily dozen  How not to diet by [employer] Plant Strong Podcast by [employer]  Engine 2 books "The vegan slow cooker" by Murvin Natal [facility] cooking classes)   Eat more Non-Starchy Vegetables . These include greens, broccoli, cauliflower, cabbage, carrots, beets, eggplant, peppers, squash and others. Minimize added sugars and refined grains . Rethink what you drink.  Choose beverages without added sugar.  Look for 0 carbs on the label. . See the list of whole grains below.  Find alternatives to usual sweet treats. Choose whole foods over processed. Make simple meals at home more often than eating out.  Tips to increase fiber in your diet: (All plants have fiber.  Eat a variety. There are more than are on this list.) Slowly increase the amount of fiber you eat to 25-35 grams per day.  (More is fine if you tolerate it.) . Fiber from whole grains, nuts and seeds o Quinoa, 1/2 cup = 5 grams o Bulgur, 1/2 cup = 4.1 grams o Popcorn, 3 cups = 3.6 grams o Whole Wheat Spaghetti, 1/2 cup = 3.2 grams o Barley, 1/2 cup = 3 grams o Oatmeal, 1/2 cup = 2 grams o Whole Wheat English Muffin = 3 grams o Corn, 1/2 cup = 2.1 grams o Brown Rice, 1/2 cup = 1.8 grams o Flax seeds, 1 Tablespoon = 2.8 grams o Chia seeds, 1 Tablespoon = 11 grams o Almonds, 1 ounce = 3.5 grams fiber . Fiber from legumes o Kidney beans, 1/2 cup 7.9 grams o Lentils, 1/2 cup = 7.8 grams o Pinto beans, 1/2 cup = 7.7 grams o Black beans, 1/2 cup = 7.6 grams o Lima beans, 1/2 cup 6.4 grams o Chick peas, 1/2 cup = 5.3 grams o Black eyed peas, 1/2 cup = 4 grams . Fiber from fruits and vegetables o Pear, 6 grams o Apple. 3.3 grams o Raspberries or Blackberries, 3/4 cup = 6 grams o Strawberries or Blueberries, 1 cup = 3.4 grams o Baked sweet potato 3.8 grams  fiber o Baked potato with skin 4.4 grams  o Peas, 1/2 cup = 4.4 grams  o Spinach, 1/2 cup cooked = 3.5 grams  o Avocado, 1/2 = 5 grams   Teaching Method Utilized:  Visual Auditory  Handouts given during visit include:  Iron deficiency nutrition therapy  Barriers to learning/adherence to lifestyle change: increased fatigue and pain  Demonstrated degree of understanding via:  Teach Back   Monitoring/Evaluation:  Dietary intake, exercise, and body weight in 4 week(s).

## 2020-02-26 NOTE — Patient Instructions (Addendum)
Listen to your body!  Consider increasing your vitamin D to 2.000-4,000 units daily. Consider taking a daily multivitamin. Consider a 2 week trial of no dairy and decreased sugar. Choose foods that have more iron.  Consider a supplement.  Now iron is labeled non constipating.  Continue to discuses causes with your MD.  Plant based resource: Nutrition Facts.org  Dr. Parke Poisson daily dozen  How not to diet by [employer] Plant Strong Podcast by Juanetta Beets  Engine 2 books "The vegan slow cooker" by Murvin Natal [facility] cooking classes)   Eat more Non-Starchy Vegetables . These include greens, broccoli, cauliflower, cabbage, carrots, beets, eggplant, peppers, squash and others. Minimize added sugars and refined grains . Rethink what you drink.  Choose beverages without added sugar.  Look for 0 carbs on the label. . See the list of whole grains below.  Find alternatives to usual sweet treats. Choose whole foods over processed. Make simple meals at home more often than eating out.  Tips to increase fiber in your diet: (All plants have fiber.  Eat a variety. There are more than are on this list.) Slowly increase the amount of fiber you eat to 25-35 grams per day.  (More is fine if you tolerate it.) . Fiber from whole grains, nuts and seeds o Quinoa, 1/2 cup = 5 grams o Bulgur, 1/2 cup = 4.1 grams o Popcorn, 3 cups = 3.6 grams o Whole Wheat Spaghetti, 1/2 cup = 3.2 grams o Barley, 1/2 cup = 3 grams o Oatmeal, 1/2 cup = 2 grams o Whole Wheat English Muffin = 3 grams o Corn, 1/2 cup = 2.1 grams o Brown Rice, 1/2 cup = 1.8 grams o Flax seeds, 1 Tablespoon = 2.8 grams o Chia seeds, 1 Tablespoon = 11 grams o Almonds, 1 ounce = 3.5 grams fiber . Fiber from legumes o Kidney beans, 1/2 cup 7.9 grams o Lentils, 1/2 cup = 7.8 grams o Pinto beans, 1/2 cup = 7.7 grams o Black beans, 1/2 cup = 7.6 grams o Lima beans, 1/2 cup 6.4 grams o Chick peas, 1/2 cup = 5.3 grams o Black eyed  peas, 1/2 cup = 4 grams . Fiber from fruits and vegetables o Pear, 6 grams o Apple. 3.3 grams o Raspberries or Blackberries, 3/4 cup = 6 grams o Strawberries or Blueberries, 1 cup = 3.4 grams o Baked sweet potato 3.8 grams fiber o Baked potato with skin 4.4 grams  o Peas, 1/2 cup = 4.4 grams  o Spinach, 1/2 cup cooked = 3.5 grams  o Avocado, 1/2 = 5 grams

## 2020-02-27 ENCOUNTER — Telehealth: Payer: Self-pay | Admitting: Hematology and Oncology

## 2020-02-27 ENCOUNTER — Other Ambulatory Visit: Payer: Self-pay | Admitting: Family

## 2020-02-27 DIAGNOSIS — D509 Iron deficiency anemia, unspecified: Secondary | ICD-10-CM

## 2020-02-27 NOTE — Telephone Encounter (Signed)
Scheduled appointment per 10/15 new patient referral. Spoke to patient who is aware of appointment date and time.  

## 2020-03-05 ENCOUNTER — Ambulatory Visit
Admission: RE | Admit: 2020-03-05 | Discharge: 2020-03-05 | Disposition: A | Discharge status: Home / Self Care | Payer: 59 | Source: Ambulatory Visit | Attending: Family | Admitting: Family

## 2020-03-05 DIAGNOSIS — E049 Nontoxic goiter, unspecified: Secondary | ICD-10-CM

## 2020-03-09 ENCOUNTER — Ambulatory Visit (INDEPENDENT_AMBULATORY_CARE_PROVIDER_SITE_OTHER): Payer: 59

## 2020-03-09 ENCOUNTER — Encounter: Payer: Self-pay | Admitting: Podiatry

## 2020-03-09 ENCOUNTER — Ambulatory Visit: Payer: BLUE CROSS/BLUE SHIELD | Admitting: Podiatry

## 2020-03-09 ENCOUNTER — Other Ambulatory Visit: Payer: Self-pay

## 2020-03-09 DIAGNOSIS — M7750 Other enthesopathy of unspecified foot: Secondary | ICD-10-CM

## 2020-03-09 DIAGNOSIS — M7741 Metatarsalgia, right foot: Secondary | ICD-10-CM

## 2020-03-09 DIAGNOSIS — M7730 Calcaneal spur, unspecified foot: Secondary | ICD-10-CM

## 2020-03-09 DIAGNOSIS — M7742 Metatarsalgia, left foot: Secondary | ICD-10-CM

## 2020-03-09 DIAGNOSIS — M7751 Other enthesopathy of right foot: Secondary | ICD-10-CM

## 2020-03-09 DIAGNOSIS — M7752 Other enthesopathy of left foot: Secondary | ICD-10-CM

## 2020-03-09 MED ORDER — MELOXICAM 15 MG PO TABS: 15.0000 mg | ORAL_TABLET | Freq: Every day | ORAL | 0 refills | Status: DC

## 2020-03-09 NOTE — Patient Instructions (Signed)

## 2020-03-09 NOTE — Progress Notes (Signed)
F/O:  k-wedge, 1/8" heel raise b/l

## 2020-03-10 NOTE — Progress Notes (Signed)
Subjective:   Patient ID: Traci Finley, female   DOB: 29 y.o.   MRN: [id number]   HPI 29 year old female presents the office today for concerns of chronic bilateral foot pain.  She states that she gets pain in the ball of her foot and she also points to the back of the heel where she gets majority discomfort.  She states that she talked to her primary care physician who told her to lose weight however she states that she has lost 60 pounds and her feet still hurt.  She denies any recent injury or trauma.  Her mom does have rheumatoid arthritis and she was recently tested and was negative for this.  She said no other treatment no other concerns today.   Review of Systems  All other systems reviewed and are negative.  Past Medical History:  Diagnosis Date  . Anemia   . Anxiety   . Asthma   . Back pain   . Chest pain   . Depression   . Frequent headaches   . Joint pain   . Persistent headaches   . Shortness of breath     Past Surgical History:  Procedure Laterality Date  . CHOLECYSTECTOMY N/A 12/14/2018   Procedure: LAPAROSCOPIC CHOLECYSTECTOMY WITH INTRAOPERATIVE CHOLANGIOGRAM;  Surgeon: Darnell Level, MD;  Location: [facility];  Service: General;  Laterality: N/A;  . CORNEAL TRANSPLANT    . WISDOM TOOTH EXTRACTION       Current Outpatient Medications:  .  cholecalciferol (VITAMIN D3) 25 MCG (1000 UNIT) tablet, Take 1,000 Units by mouth daily., Disp: , Rfl:  .  glucosamine-chondroitin 500-400 MG tablet, Take 1 tablet by mouth 3 (three) times daily., Disp: , Rfl:  .  meloxicam (MOBIC) 15 MG tablet, Take 1 tablet (15 mg total) by mouth daily., Disp: 30 tablet, Rfl: 0 .  Turmeric 1053 MG TABS, Take by mouth., Disp: , Rfl:   No Known Allergies       Objective:  Physical Exam  General: AAO x3, NAD  Dermatological: Skin is warm, dry and supple bilateral. There are no open sores, no preulcerative lesions, no rash or signs of infection present.  Vascular: Dorsalis Pedis artery and  Posterior Tibial artery pedal pulses are 2/4 bilateral with immedate capillary fill time. There is no pain with calf compression, swelling, warmth, erythema.   Neruologic: Grossly intact via light touch bilateral.   Musculoskeletal: Majority discomfort is on the posterior aspect of calcaneus and insertion of the Achilles tendon.  There is no pain with lateral compression of calcaneus.  There is tenderness along the sesamoids bilaterally.  There is no area pinpoint tenderness.  There is no edema, erythema.  Muscular strength 5/5 in all groups tested bilateral.  Gait: Unassisted, Nonantalgic.       Assessment:   29 year old female with insertional Achilles tendinitis, heel spurs; metatarsalgia    Plan:  -Treatment options discussed including all alternatives, risks, and complications -Etiology of symptoms were discussed -X-rays were obtained and reviewed with the patient.  Posterior calcaneal spurring is evident there is no evidence of acute fracture identified today. -We discussed traction, icing daily.  Discussed shoe modifications and orthotics.  She was measured for orthotics today with Raiford Noble.  Meloxicam was ordered.  She is already been tested for autoimmune issues and we held off on further testing today.

## 2020-03-26 ENCOUNTER — Encounter: Payer: Self-pay | Admitting: Hematology and Oncology

## 2020-03-26 ENCOUNTER — Other Ambulatory Visit: Payer: Self-pay

## 2020-03-26 ENCOUNTER — Inpatient Hospital Stay: Payer: 59 | Attending: Hematology and Oncology | Admitting: Hematology and Oncology

## 2020-03-26 DIAGNOSIS — D500 Iron deficiency anemia secondary to blood loss (chronic): Secondary | ICD-10-CM | POA: Insufficient documentation

## 2020-03-26 DIAGNOSIS — N920 Excessive and frequent menstruation with regular cycle: Secondary | ICD-10-CM | POA: Diagnosis not present

## 2020-03-26 DIAGNOSIS — N946 Dysmenorrhea, unspecified: Secondary | ICD-10-CM | POA: Insufficient documentation

## 2020-03-26 DIAGNOSIS — D509 Iron deficiency anemia, unspecified: Secondary | ICD-10-CM | POA: Insufficient documentation

## 2020-03-26 NOTE — Assessment & Plan Note (Signed)
The most likely cause of her anemia is due to chronic blood loss We discussed some of the risks, benefits, and alternatives of intravenous iron infusions. The patient is symptomatic from anemia and the iron level is critically low. She tolerated oral iron supplement poorly and desires to achieved higher levels of iron faster for adequate hematopoesis. Some of the side-effects to be expected including risks of infusion reactions, phlebitis, headaches, nausea and fatigue.  The patient is willing to proceed. Patient education material was dispensed.  Goal is to keep ferritin level greater than 50 and resolution of anemia  I plan weekly IV iron x3 and then see her back in 2 months for further follow-up

## 2020-03-26 NOTE — Assessment & Plan Note (Signed)
I recommend GYN follow-up to discuss hormonal therapy to see if this could help with reducing menorrhagia

## 2020-03-26 NOTE — Assessment & Plan Note (Signed)
I recommend her to avoid taking ibuprofen due to risk of heavy bleeding I recommend she takes acetaminophen as needed if possible

## 2020-03-26 NOTE — Progress Notes (Signed)
[facility] CONSULT NOTE  Patient Care Team: Myrlene Broker, MD as PCP - General (Internal Medicine)  CHIEF COMPLAINTS/PURPOSE OF CONSULTATION:  Severe iron deficiency anemia  HISTORY OF PRESENTING ILLNESS:  Traci Finley 29 y.o. female is here because of severe iron deficiency anemia, likely secondary to heavy menstruation  She was found to have abnormal CBC from recent blood work I have the opportunity to review his CBC dated back to April 28, 2014 Her CBC showed hemoglobin ranges from 9.5-12.5 She denies recent chest pain on exertion, shortness of breath on minimal exertion, pre-syncopal episodes, or palpitations. She complain of excessive fatigue She had not noticed any recent bleeding such as epistaxis, hematuria or hematochezia The patient has taken intermittent ibuprofen for perimenstrual symptoms with pelvic pain. She is not on antiplatelets agents.  She had no prior history or diagnosis of cancer. Her age appropriate screening programs are up-to-date. She has pica with excessive chewing of ice; she used to eat a variety of diet but recently switched to a plant-based diet to see if that could help with her menstrual cramps. She felt that plant-based diet reduces perimenstrual cramping and passage of large amount of clots Menstrual cycle consists of 5 to 6 days of heavy bleeding with clots monthly  She has donated blood remotely. She has never received blood transfusion The patient was prescribed oral iron supplements and she takes daily but it causes excessive constipation  MEDICAL HISTORY:  Past Medical History:  Diagnosis Date  . Anemia   . Anxiety   . Asthma   . Back pain   . Chest pain   . Depression   . Frequent headaches   . Joint pain   . Persistent headaches   . Shortness of breath     SURGICAL HISTORY: Past Surgical History:  Procedure Laterality Date  . CHOLECYSTECTOMY N/A 12/14/2018   Procedure: LAPAROSCOPIC CHOLECYSTECTOMY WITH  INTRAOPERATIVE CHOLANGIOGRAM;  Surgeon: Darnell Level, MD;  Location: [facility];  Service: General;  Laterality: N/A;  . CORNEAL TRANSPLANT    . WISDOM TOOTH EXTRACTION      SOCIAL HISTORY: Social History   Socioeconomic History  . Marital status: Single    Spouse name: Not on file  . Number of children: 0  . Years of education: Not on file  . Highest education level: Not on file  Occupational History  . Occupation: Arboriculturist III    Employer: [employer]  Tobacco Use  . Smoking status: Former Smoker    Packs/day: 12.00    Years: 2.00    Pack years: 24.00    Quit date: 2015    Years since quitting: 6.8  . Smokeless tobacco: Never Used  Vaping Use  . Vaping Use: Never used  Substance and Sexual Activity  . Alcohol use: No  . Drug use: No  . Sexual activity: Not on file  Other Topics Concern  . Not on file  Social History Narrative   Lives with girlfriend    Social Determinants of Health   Financial Resource Strain:   . Difficulty of Paying Living Expenses: Not on file  Food Insecurity:   . Worried About Programme researcher, broadcasting/film/video in the Last Year: Not on file  . Ran Out of Food in the Last Year: Not on file  Transportation Needs:   . Lack of Transportation (Medical): Not on file  . Lack of Transportation (Non-Medical): Not on file  Physical Activity:   . Days of Exercise per  Week: Not on file  . Minutes of Exercise per Session: Not on file  Stress:   . Feeling of Stress : Not on file  Social Connections:   . Frequency of Communication with Friends and Family: Not on file  . Frequency of Social Gatherings with Friends and Family: Not on file  . Attends Religious Services: Not on file  . Active Member of Clubs or Organizations: Not on file  . Attends Banker Meetings: Not on file  . Marital Status: Not on file  Intimate Partner Violence:   . Fear of Current or Ex-Partner: Not on file  . Emotionally Abused: Not on file  . Physically Abused:  Not on file  . Sexually Abused: Not on file    FAMILY HISTORY: Family History  Problem Relation Age of Onset  . Arthritis Mother   . Hypertension Mother   . Cancer Mother        thyroid  . Thyroid disease Mother   . Obesity Mother   . Diabetes Maternal Uncle   . Arthritis Maternal Grandmother   . Hypertension Maternal Grandmother   . Heart disease Maternal Grandmother   . Diabetes Maternal Grandmother   . Cancer Maternal Grandmother        thyroid    ALLERGIES:  has No Known Allergies.  MEDICATIONS:  Current Outpatient Medications  Medication Sig Dispense Refill  . cholecalciferol (VITAMIN D3) 25 MCG (1000 UNIT) tablet Take 1,000 Units by mouth daily.    Marland Kitchen glucosamine-chondroitin 500-400 MG tablet Take 1 tablet by mouth 3 (three) times daily.    . meloxicam (MOBIC) 15 MG tablet Take 1 tablet (15 mg total) by mouth daily. 30 tablet 0  . Turmeric 1053 MG TABS Take by mouth.     No current facility-administered medications for this visit.    REVIEW OF SYSTEMS:   Constitutional: Denies fevers, chills or abnormal night sweats Eyes: Denies blurriness of vision, double vision or watery eyes Ears, nose, mouth, throat, and face: Denies mucositis or sore throat Respiratory: Denies cough, dyspnea or wheezes Cardiovascular: Denies palpitation, chest discomfort or lower extremity swelling Gastrointestinal:  Denies nausea, heartburn or change in bowel habits Skin: Denies abnormal skin rashes Lymphatics: Denies new lymphadenopathy or easy bruising Neurological:Denies numbness, tingling or new weaknesses Behavioral/Psych: Mood is stable, no new changes  All other systems were reviewed with the patient and are negative.  PHYSICAL EXAMINATION: ECOG PERFORMANCE STATUS: 1 - Symptomatic but completely ambulatory  Vitals:   03/26/20 1305  BP: 138/83  Pulse: 70  Resp: 18  Temp: 98.2 F (36.8 C)  SpO2: 100%   Filed Weights   03/26/20 1305  Weight: 180 lb 3.2 oz (81.7 kg)     GENERAL:alert, no distress and comfortable SKIN: skin color, texture, turgor are normal, no rashes or significant lesions EYES: normal, conjunctiva are pink and non-injected, sclera clear OROPHARYNX:no exudate, no erythema and lips, buccal mucosa, and tongue normal  NECK: supple, thyroid normal size, non-tender, without nodularity LYMPH:  no palpable lymphadenopathy in the cervical, axillary or inguinal LUNGS: clear to auscultation and percussion with normal breathing effort HEART: regular rate & rhythm and no murmurs and no lower extremity edema ABDOMEN:abdomen soft, non-tender and normal bowel sounds Musculoskeletal:no cyanosis of digits and no clubbing  PSYCH: alert & oriented x 3 with fluent speech NEURO: no focal motor/sensory deficits  RADIOGRAPHIC STUDIES: I have personally reviewed the radiological images as listed and agreed with the findings in the report. DG Foot Complete Left  Result Date: 03/09/2020 Please see detailed radiograph report in office note.  DG Foot Complete Right  Result Date: 03/09/2020 Please see detailed radiograph report in office note.  US THYROID  Result Date: 03/05/2020 CLINICAL DATA:  29 year old female with a history of enlarged thyroid EXAM: THYROID ULTRASOUND TECHNIQUE: Ultrasound examination of the thyroid gland and adjacent soft tissues was performed. COMPARISON:  None. FINDINGS: Parenchymal Echotexture: Normal Isthmus: 0.4 cm Right lobe: 5.2 cm x 1.5 cm x 1.8 cm Left lobe: 5.0 cm x 1.5 cm x 1.7 cm _________________________________________________________ Estimated total number of nodules >/= 1 cm: 0 Number of spongiform nodules >/=  2 cm not described below (TR1): 0 Number of mixed cystic and solid nodules >/= 1.5 cm not described below (TR2): 0 _________________________________________________________ No discrete nodules are seen within the thyroid gland. IMPRESSION: Relatively unremarkable sonographic survey of the thyroid Electronically  Signed   By: Gilmer Mor D.O.   On: 03/05/2020 14:06    ASSESSMENT & PLAN:  Iron deficiency anemia The most likely cause of her anemia is due to chronic blood loss We discussed some of the risks, benefits, and alternatives of intravenous iron infusions. The patient is symptomatic from anemia and the iron level is critically low. She tolerated oral iron supplement poorly and desires to achieved higher levels of iron faster for adequate hematopoesis. Some of the side-effects to be expected including risks of infusion reactions, phlebitis, headaches, nausea and fatigue.  The patient is willing to proceed. Patient education material was dispensed.  Goal is to keep ferritin level greater than 50 and resolution of anemia  I plan weekly IV iron x3 and then see her back in 2 months for further follow-up  Menorrhagia with regular cycle I recommend GYN follow-up to discuss hormonal therapy to see if this could help with reducing menorrhagia  Moderate cramps with menses I recommend her to avoid taking ibuprofen due to risk of heavy bleeding I recommend she takes acetaminophen as needed if possible  Orders Placed This Encounter  Procedures  . Iron and TIBC    Standing Status:   Standing    Number of Occurrences:   3    Standing Expiration Date:   03/26/2021  . Ferritin    Standing Status:   Standing    Number of Occurrences:   3    Standing Expiration Date:   03/26/2021  . CBC with Differential/Platelet    Standing Status:   Standing    Number of Occurrences:   22    Standing Expiration Date:   03/26/2021     All questions were answered. The patient knows to call the clinic with any problems, questions or concerns.  The total time spent in the appointment was 55 minutes encounter with patients including review of chart and various tests results, discussions about plan of care and coordination of care plan  Artis Delay, MD 11/12/20211:48 PM       Artis Delay, MD 03/26/20 1:48  PM

## 2020-04-01 ENCOUNTER — Other Ambulatory Visit: Payer: Self-pay

## 2020-04-01 ENCOUNTER — Encounter: Payer: 59 | Attending: Internal Medicine | Admitting: Dietician

## 2020-04-01 DIAGNOSIS — E8881 Metabolic syndrome: Secondary | ICD-10-CM | POA: Insufficient documentation

## 2020-04-01 NOTE — Patient Instructions (Addendum)
Begin taking a vitamin B-12 supplement. Great job on changes made! Continue Meal Prep Continue mindfulness Think of calorie density  Protein supplement option: Orgain Simply

## 2020-04-01 NOTE — Progress Notes (Addendum)
Medical Nutrition Therapy:  Appt start time: 1515 end time:  1540.   Assessment:  Primary concerns today:  Patient is here today alone.  She has been on a plant based diet since about 3 days after seeing me on 02/26/2020. Consistent energy. Going to the gym. Nails are now much harder. Bowel movements are now regular. Period started last week and has not felt as well since but her pain has improved overall and was somewhat less which patient attributes to the nutrition changes.  She has an upcoming OB appointment.  She goes for her first iron infusion 04/02/2020. Drinking red raspberry leaf which she feels has helped her menstrual pain. She states that binging is only an issue when she does not drink enough water or get enough protein. Uses My Fitness Pal to count calories. Weight increased slightly.  History includes Insulin resistance, anemia, very heavy periods, thyroid concerns for patient, increased pain in legs and obtaining a work up, vitamin D deficiency A1C 5%, Vitamin D 27.6, Hemoglobin 10.5%  Weight hx: 176.5 lbs 04/01/2020 175 lbs 12/27/19 Greater than 240 lbs 2 years ago   Body Composition Scale Apr 01 2020  Current Body Weight 176.5 lbs  Total Body Fat % 32.8  Visceral Fat 7  Fat-Free Mass % 67.1   Total Body Water % 48  Muscle-Mass lbs 33.2  BMI 28.8  Body Fat Displacement          Torso  lbs 35.2         Left Leg  lbs 7.1         Right Leg  lbs 7.1         Left Arm  lbs 3.5         Right Arm   lbs 3.5    Patient lives with her girlfriend.  They have an adopted 34 year old daughter. Her daughter does not like meat. They share shopping and Elizah does the cooking.  She used to see Dr. Malena Edman but wanted a more personal whole foods approach. Her girlfriend has a lot of health issues and is overweight and chooses many unhealthy choices. She works for [employer] from home.  Preferred Learning Style:   No preference indicated   Learning Readiness:    Ready  Change in progress   MEDICATIONS: see list to include vitamin d (1,000 units daily), turmeric    DIETARY INTAKE: Plant based, 3 meals, no snacks for the past month.  24-hr recall:  B ( AM): Quick Oatmeal, PB2, banana OR eggs (1 whole, 2 whites), 1 slice 45 calorie bread, cream of rice Snk ( AM): none  L ( PM): brown rice, kidney beans Snk ( PM):  D ( PM): Buddha bowl (salad, grain, beans, etc.) Snk ( PM): Beverages: water, occasional sugar free flavor packets  Usual physical activity: on days she feels well (jump ropes, weights, bike)  Currently none due to pain and increased fatigue.  Estimated energy needs: for weight loss 1400 calories 70 g protein  Progress Towards Goal(s):  In progress.   Nutritional Diagnosis:  NB-1.1 Food and nutrition-related knowledge deficit As related to balance of carbohydrates, protein, carbohydrate and other nutrients.  As evidenced by diet hx and patient history.    Intervention:  Nutrition counseling/education continued.  Discussed the changes that she has made, how she is feeling, and that her body is changing metabolically with expected improvement of insulin resistance  despite lack of weight loss.   Discussed calorie density. Called patient  and instructed her to start a sublingual or dissolvable vitamin B-12 as this is lacking in a plant based diet.  Plan: Begin taking a vitamin B-12 supplement. Great job on changes made! Continue Meal Prep Continue mindfulness Think of calorie density  Protein supplement option: Orgain Simply   Plant based resource: Nutrition Facts.org  Dr. Parke Poisson daily dozen  How not to diet by [employer] Plant Strong Podcast by Juanetta Beets  Engine 2 books "The vegan slow cooker" by Murvin Natal [facility] cooking classes)   Eat more Non-Starchy Vegetables . These include greens, broccoli, cauliflower, cabbage, carrots, beets, eggplant, peppers, squash and others. Minimize added sugars and  refined grains . Rethink what you drink.  Choose beverages without added sugar.  Look for 0 carbs on the label. . See the list of whole grains below.  Find alternatives to usual sweet treats. Choose whole foods over processed. Make simple meals at home more often than eating out.  Tips to increase fiber in your diet: (All plants have fiber.  Eat a variety. There are more than are on this list.) Slowly increase the amount of fiber you eat to 25-35 grams per day.  (More is fine if you tolerate it.) . Fiber from whole grains, nuts and seeds o Quinoa, 1/2 cup = 5 grams o Bulgur, 1/2 cup = 4.1 grams o Popcorn, 3 cups = 3.6 grams o Whole Wheat Spaghetti, 1/2 cup = 3.2 grams o Barley, 1/2 cup = 3 grams o Oatmeal, 1/2 cup = 2 grams o Whole Wheat English Muffin = 3 grams o Corn, 1/2 cup = 2.1 grams o Brown Rice, 1/2 cup = 1.8 grams o Flax seeds, 1 Tablespoon = 2.8 grams o Chia seeds, 1 Tablespoon = 11 grams o Almonds, 1 ounce = 3.5 grams fiber . Fiber from legumes o Kidney beans, 1/2 cup 7.9 grams o Lentils, 1/2 cup = 7.8 grams o Pinto beans, 1/2 cup = 7.7 grams o Black beans, 1/2 cup = 7.6 grams o Lima beans, 1/2 cup 6.4 grams o Chick peas, 1/2 cup = 5.3 grams o Black eyed peas, 1/2 cup = 4 grams . Fiber from fruits and vegetables o Pear, 6 grams o Apple. 3.3 grams o Raspberries or Blackberries, 3/4 cup = 6 grams o Strawberries or Blueberries, 1 cup = 3.4 grams o Baked sweet potato 3.8 grams fiber o Baked potato with skin 4.4 grams  o Peas, 1/2 cup = 4.4 grams  o Spinach, 1/2 cup cooked = 3.5 grams  o Avocado, 1/2 = 5 grams   Teaching Method Utilized:  Visual Auditory  Handouts given during visit include:  Iron deficiency nutrition therapy  Barriers to learning/adherence to lifestyle change: increased fatigue and pain  Demonstrated degree of understanding via:  Teach Back   Monitoring/Evaluation:  Dietary intake, exercise, and body weight in 7-8 weeks.

## 2020-04-02 ENCOUNTER — Encounter: Payer: Self-pay | Admitting: Dietician

## 2020-04-02 ENCOUNTER — Other Ambulatory Visit: Payer: Self-pay

## 2020-04-02 ENCOUNTER — Inpatient Hospital Stay: Payer: 59

## 2020-04-02 VITALS — BP 116/75 | HR 54 | Temp 98.6°F | Resp 18

## 2020-04-02 DIAGNOSIS — D500 Iron deficiency anemia secondary to blood loss (chronic): Secondary | ICD-10-CM

## 2020-04-02 DIAGNOSIS — N920 Excessive and frequent menstruation with regular cycle: Secondary | ICD-10-CM | POA: Diagnosis not present

## 2020-04-02 MED ORDER — SODIUM CHLORIDE 0.9 % IV SOLN
Freq: Once | INTRAVENOUS | Status: AC
  Filled 2020-04-02: qty 250

## 2020-04-02 MED ORDER — SODIUM CHLORIDE 0.9 % IV SOLN
300.0000 mg | Freq: Once | INTRAVENOUS | Status: AC
  Administered 2020-04-02: 300 mg via INTRAVENOUS
  Filled 2020-04-02: qty 5

## 2020-04-02 NOTE — Patient Instructions (Signed)

## 2020-04-06 ENCOUNTER — Other Ambulatory Visit: Payer: Self-pay

## 2020-04-06 ENCOUNTER — Ambulatory Visit: Payer: 59 | Admitting: Orthotics

## 2020-04-06 DIAGNOSIS — M7750 Other enthesopathy of unspecified foot: Secondary | ICD-10-CM

## 2020-04-06 NOTE — Progress Notes (Signed)
Patient picked up f/o and was pleased with fit, comfort, and function.  Worked well with footwear.  Told of rbeak in period and how to report any issues.  

## 2020-04-09 ENCOUNTER — Inpatient Hospital Stay: Payer: 59

## 2020-04-09 ENCOUNTER — Other Ambulatory Visit: Payer: Self-pay

## 2020-04-09 VITALS — BP 107/75 | HR 74 | Temp 97.9°F | Resp 16

## 2020-04-09 DIAGNOSIS — D500 Iron deficiency anemia secondary to blood loss (chronic): Secondary | ICD-10-CM

## 2020-04-09 DIAGNOSIS — N920 Excessive and frequent menstruation with regular cycle: Secondary | ICD-10-CM | POA: Diagnosis not present

## 2020-04-09 MED ORDER — SODIUM CHLORIDE 0.9 % IV SOLN
300.0000 mg | Freq: Once | INTRAVENOUS | Status: AC
  Administered 2020-04-09: 300 mg via INTRAVENOUS
  Filled 2020-04-09: qty 10

## 2020-04-09 MED ORDER — SODIUM CHLORIDE 0.9 % IV SOLN
Freq: Once | INTRAVENOUS | Status: AC
  Filled 2020-04-09: qty 250

## 2020-04-09 NOTE — Progress Notes (Signed)
Pt discharged in stable condition.

## 2020-04-09 NOTE — Patient Instructions (Signed)

## 2020-04-16 ENCOUNTER — Other Ambulatory Visit: Payer: Self-pay

## 2020-04-16 ENCOUNTER — Inpatient Hospital Stay: Payer: 59 | Attending: Hematology and Oncology

## 2020-04-16 VITALS — BP 109/69 | HR 69 | Temp 98.6°F | Resp 16

## 2020-04-16 DIAGNOSIS — N920 Excessive and frequent menstruation with regular cycle: Secondary | ICD-10-CM | POA: Insufficient documentation

## 2020-04-16 DIAGNOSIS — D500 Iron deficiency anemia secondary to blood loss (chronic): Secondary | ICD-10-CM | POA: Diagnosis present

## 2020-04-16 MED ORDER — SODIUM CHLORIDE 0.9 % IV SOLN
300.0000 mg | Freq: Once | INTRAVENOUS | Status: AC
  Administered 2020-04-16: 300 mg via INTRAVENOUS
  Filled 2020-04-16: qty 10

## 2020-04-16 MED ORDER — SODIUM CHLORIDE 0.9 % IV SOLN
Freq: Once | INTRAVENOUS | Status: AC
  Filled 2020-04-16: qty 250

## 2020-04-16 NOTE — Progress Notes (Signed)
[facility] and with no signs of acute distress.    

## 2020-04-16 NOTE — Patient Instructions (Signed)

## 2020-05-04 ENCOUNTER — Other Ambulatory Visit: Payer: Self-pay

## 2020-05-04 ENCOUNTER — Ambulatory Visit: Payer: 59 | Admitting: Podiatry

## 2020-05-04 DIAGNOSIS — M7730 Calcaneal spur, unspecified foot: Secondary | ICD-10-CM | POA: Diagnosis not present

## 2020-05-04 DIAGNOSIS — M216X2 Other acquired deformities of left foot: Secondary | ICD-10-CM

## 2020-05-04 DIAGNOSIS — M216X1 Other acquired deformities of right foot: Secondary | ICD-10-CM

## 2020-05-04 DIAGNOSIS — M7750 Other enthesopathy of unspecified foot: Secondary | ICD-10-CM

## 2020-05-06 NOTE — Progress Notes (Signed)
Subjective: 29 year old female presents the office today for follow-up evaluation of bilateral foot pain.  She says overall she is doing much better but still some tightness.  She states the orthotics have been helpful as well.  No significant pain today.  She has no other concerns today. Denies any systemic complaints such as fevers, chills, nausea, vomiting. No acute changes since last appointment, and no other complaints at this time.   Objective: AAO x3, NAD DP/PT pulses palpable bilaterally, CRT less than 3 seconds Not able to elicit any area of pinpoint tenderness today.  Flexor, extensor tendons appear to be intact.  There is no edema, erythema.  There is equinus noted.  No pain with calf compression, swelling, warmth, erythema  Assessment: Resolving bilateral foot pain with equinus  Plan: -All treatment options discussed with the patient including all alternatives, risks, complications.  -Overall doing better.  Continue stretching, ice exercises at home as well as orthotics and supportive shoes.  The orthotics have been comfortable.  Referral to physical therapy placed today for benchmark. -Patient encouraged to call the office with any questions, concerns, change in symptoms.   Vivi Barrack DPM

## 2020-05-17 ENCOUNTER — Ambulatory Visit: Payer: 59 | Admitting: Dietician

## 2020-05-28 ENCOUNTER — Encounter: Payer: Self-pay | Admitting: Hematology and Oncology

## 2020-05-28 ENCOUNTER — Inpatient Hospital Stay: Payer: 59

## 2020-05-28 ENCOUNTER — Inpatient Hospital Stay: Payer: 59 | Attending: Hematology and Oncology | Admitting: Hematology and Oncology

## 2020-10-05 ENCOUNTER — Telehealth: Payer: Self-pay | Admitting: Internal Medicine

## 2020-10-05 ENCOUNTER — Encounter: Payer: Self-pay | Admitting: Hematology and Oncology

## 2020-10-05 DIAGNOSIS — R55 Syncope and collapse: Secondary | ICD-10-CM

## 2020-10-05 NOTE — Telephone Encounter (Signed)
See below

## 2020-10-05 NOTE — Telephone Encounter (Signed)
Patient changed appt to a follow up on 5/27. She said that she would still like to have blood work done prior to the appointment because she feels like her blood levels are off and she would like to go over the results with Dr. Okey Dupre at her appointment. She can be reached at [phone]. Please advise

## 2020-10-05 NOTE — Telephone Encounter (Signed)
   Patient requesting order for annual labs prior to 5/27 appointment

## 2020-10-05 NOTE — Telephone Encounter (Signed)
I'm not sure what blood levels means? Is she talking about kidney/liver function? Blood counts (anemia)? Thyroid, some other kind of lab? I need more information other than blood work.

## 2020-10-05 NOTE — Telephone Encounter (Signed)
She is not due for physical until August 2022 so would need to reschedule this.

## 2020-10-06 NOTE — Telephone Encounter (Signed)
See below

## 2020-10-06 NOTE — Telephone Encounter (Signed)
Patient is following up regarding labs, states she would like lab work done prior to appointment  Patient has been on a vegan diet for the last 6 months and yesterday she felt like she was going to pass out, very shaky, brain fog is better, but still not feeling right  Considering B12, Vitamin D, iron  Felt better after eating eggs, salmon and shrimp yesterday  Please follow-up with the patient as she would like to have the labs entered before her appointment.

## 2020-10-07 NOTE — Telephone Encounter (Signed)
Have placed labs but in the future for a new problem we would typically do visit and evaluation to see what blood work if any are needed before ordering. This could be denied on insurance coverage since it is not clear what the diagnosis for this is.

## 2020-10-08 ENCOUNTER — Ambulatory Visit: Payer: BC Managed Care – PPO | Admitting: Internal Medicine

## 2020-10-08 ENCOUNTER — Encounter: Payer: Self-pay | Admitting: Internal Medicine

## 2020-10-08 ENCOUNTER — Other Ambulatory Visit: Payer: Self-pay

## 2020-10-08 DIAGNOSIS — R5383 Other fatigue: Secondary | ICD-10-CM | POA: Diagnosis not present

## 2020-10-08 DIAGNOSIS — D500 Iron deficiency anemia secondary to blood loss (chronic): Secondary | ICD-10-CM | POA: Diagnosis not present

## 2020-10-08 DIAGNOSIS — R55 Syncope and collapse: Secondary | ICD-10-CM | POA: Diagnosis not present

## 2020-10-08 DIAGNOSIS — E559 Vitamin D deficiency, unspecified: Secondary | ICD-10-CM

## 2020-10-08 LAB — CBC
HCT: 37.3 % (ref 36.0–46.0)
Hemoglobin: 13 g/dL (ref 12.0–15.0)
MCHC: 34.8 g/dL (ref 30.0–36.0)
MCV: 93.8 fl (ref 78.0–100.0)
Platelets: 298 10*3/uL (ref 150.0–400.0)
RBC: 3.98 Mil/uL (ref 3.87–5.11)
RDW: 12.7 % (ref 11.5–15.5)
WBC: 5 10*3/uL (ref 4.0–10.5)

## 2020-10-08 LAB — TSH: TSH: 3.03 u[IU]/mL (ref 0.35–4.50)

## 2020-10-08 LAB — COMPREHENSIVE METABOLIC PANEL
ALT: 7 U/L (ref 0–35)
AST: 12 U/L (ref 0–37)
Albumin: 4 g/dL (ref 3.5–5.2)
Alkaline Phosphatase: 58 U/L (ref 39–117)
BUN: 11 mg/dL (ref 6–23)
CO2: 29 mEq/L (ref 19–32)
Calcium: 9 mg/dL (ref 8.4–10.5)
Chloride: 104 mEq/L (ref 96–112)
Creatinine, Ser: 0.57 mg/dL (ref 0.40–1.20)
GFR: 120.94 mL/min (ref >60.00)
Glucose, Bld: 85 mg/dL (ref 70–99)
Potassium: 3.7 mEq/L (ref 3.5–5.1)
Sodium: 140 mEq/L (ref 135–145)
Total Bilirubin: 0.6 mg/dL (ref 0.2–1.2)
Total Protein: 6.8 g/dL (ref 6.0–8.3)

## 2020-10-08 LAB — VITAMIN D 25 HYDROXY (VIT D DEFICIENCY, FRACTURES): VITD: 33.79 ng/mL (ref 30.00–100.00)

## 2020-10-08 LAB — VITAMIN B12: Vitamin B-12: 1027 pg/mL — ABNORMAL HIGH (ref 211–911)

## 2020-10-08 NOTE — Patient Instructions (Signed)
We will get the blood work checked out and get you feeling good again.

## 2020-10-08 NOTE — Assessment & Plan Note (Signed)
Checking CBC and ferritin.  

## 2020-10-08 NOTE — Assessment & Plan Note (Signed)
Checking vitamin D level and adjust as needed. Taking 1000 units OTC.

## 2020-10-08 NOTE — Progress Notes (Signed)
   Subjective:   Patient ID: Traci Finley, female    DOB: 10/15/1990, 30 y.o.   MRN: [id number]  HPI The patient is a 30 YO female coming in for concerns about feeling bad. Dizzy and feels like she might pass out sometimes. She has history of iron deficiency and worried about same. She did change to vegan diet last fall to help with weight loss and has been successful with this. She has been incorporating some meat/fish products in the last week and feels like this has made a difference.   Review of Systems  Constitutional: Positive for fatigue.  HENT: Negative.   Eyes: Negative.   Respiratory: Negative for cough, chest tightness and shortness of breath.   Cardiovascular: Negative for chest pain, palpitations and leg swelling.  Gastrointestinal: Negative for abdominal distention, abdominal pain, constipation, diarrhea, nausea and vomiting.  Musculoskeletal: Negative.   Skin: Negative.   Neurological: Positive for dizziness and light-headedness.  Psychiatric/Behavioral: Negative.     Objective:  Physical Exam Constitutional:      Appearance: She is well-developed. She is obese.  HENT:     Head: Normocephalic and atraumatic.  Cardiovascular:     Rate and Rhythm: Normal rate and regular rhythm.  Pulmonary:     Effort: Pulmonary effort is normal. No respiratory distress.     Breath sounds: Normal breath sounds. No wheezing or rales.  Abdominal:     General: Bowel sounds are normal. There is no distension.     Palpations: Abdomen is soft.     Tenderness: There is no abdominal tenderness. There is no rebound.  Musculoskeletal:     Cervical back: Normal range of motion.  Skin:    General: Skin is warm and dry.  Neurological:     Mental Status: She is alert and oriented to person, place, and time.     Coordination: Coordination normal.     Vitals:   10/08/20 0818  BP: 118/80  Pulse: 72  Resp: 18  Temp: 98.4 F (36.9 C)  TempSrc: Oral  SpO2: 97%  Weight: 183 lb 3.2 oz  (83.1 kg)  Height: 5\' 5"  (1.651 m)    This visit occurred during the SARS-CoV-2 public health emergency.  Safety protocols were in place, including screening questions prior to the visit, additional usage of staff PPE, and extensive cleaning of exam room while observing appropriate contact time as indicated for disinfecting solutions.   Assessment & Plan:

## 2020-10-08 NOTE — Assessment & Plan Note (Signed)
Checking TSH, CBC, CMP, vitamin D and B12.

## 2020-11-17 DIAGNOSIS — Z20822 Contact with and (suspected) exposure to covid-19: Secondary | ICD-10-CM | POA: Diagnosis not present

## 2021-01-28 ENCOUNTER — Ambulatory Visit: Payer: BC Managed Care – PPO | Admitting: Internal Medicine

## 2021-01-28 ENCOUNTER — Other Ambulatory Visit: Payer: Self-pay

## 2021-01-28 ENCOUNTER — Encounter: Payer: Self-pay | Admitting: Internal Medicine

## 2021-01-28 DIAGNOSIS — R590 Localized enlarged lymph nodes: Secondary | ICD-10-CM | POA: Diagnosis not present

## 2021-01-28 MED ORDER — LEVOCETIRIZINE DIHYDROCHLORIDE 5 MG PO TABS: 5.0000 mg | ORAL_TABLET | Freq: Every evening | ORAL | 0 refills | Status: DC

## 2021-01-28 NOTE — Patient Instructions (Signed)
I have sent in a prescription for xyzal which is a 1 pill daily allergy medication. If this is not covered through your insurance it is likely cheaper to buy 2 weeks supply of zyrtec (cetirizine) over the counter store brand to take 1 pill daily for 1-2 weeks to clear this up.

## 2021-01-28 NOTE — Progress Notes (Signed)
   Subjective:   Patient ID: Traci Finley, female    DOB: 11-15-1990, 30 y.o.   MRN: [id number]  HPI The patient is a 30 YO female coming in for mold exposure and sinus congestion. Lymph node on right neck for 3 weeks. They are fixing the mold in the apartment and since leaving the apartment she is feeling some improved.  Review of Systems  Constitutional: Negative.   HENT:  Positive for congestion, sinus pressure and sore throat.   Eyes: Negative.   Respiratory:  Negative for cough, chest tightness and shortness of breath.   Cardiovascular:  Negative for chest pain, palpitations and leg swelling.  Gastrointestinal:  Negative for abdominal distention, abdominal pain, constipation, diarrhea, nausea and vomiting.  Musculoskeletal: Negative.   Skin: Negative.   Neurological: Negative.   Psychiatric/Behavioral: Negative.     Objective:  Physical Exam Constitutional:      Appearance: She is well-developed.  HENT:     Head: Normocephalic and atraumatic.     Nose: Rhinorrhea present.     Mouth/Throat:     Pharynx: Oropharyngeal exudate present.  Neck:     Comments: Right neck with LAD Cardiovascular:     Rate and Rhythm: Normal rate and regular rhythm.  Pulmonary:     Effort: Pulmonary effort is normal. No respiratory distress.     Breath sounds: Normal breath sounds. No wheezing or rales.  Abdominal:     General: Bowel sounds are normal. There is no distension.     Palpations: Abdomen is soft.     Tenderness: There is no abdominal tenderness. There is no rebound.  Musculoskeletal:     Cervical back: Normal range of motion.  Lymphadenopathy:     Cervical: Cervical adenopathy present.  Skin:    General: Skin is warm and dry.  Neurological:     Mental Status: She is alert and oriented to person, place, and time.     Coordination: Coordination normal.    Vitals:   01/28/21 0932  BP: 120/70  Pulse: 71  Resp: 18  Temp: 98.3 F (36.8 C)  TempSrc: Oral  SpO2: 99%   Weight: 190 lb 9.6 oz (86.5 kg)  Height: 5\' 5"  (1.651 m)    This visit occurred during the SARS-CoV-2 public health emergency.  Safety protocols were in place, including screening questions prior to the visit, additional usage of staff PPE, and extensive cleaning of exam room while observing appropriate contact time as indicated for disinfecting solutions.   Assessment & Plan:

## 2021-01-28 NOTE — Assessment & Plan Note (Signed)
Likely due to mold exposure and feels benign. Rx xyzal and lungs are clear no antibiotics are needed.

## 2021-05-04 ENCOUNTER — Ambulatory Visit: Payer: BC Managed Care – PPO | Admitting: Internal Medicine

## 2021-05-07 IMAGING — US US PELVIS COMPLETE
1 series · 14 of 25 positions shown · non-contrast
Comparison: April 28, 2014

CLINICAL DATA: Pelvic pain

EXAM:
TRANSABDOMINAL ULTRASOUND OF PELVIS
TECHNIQUE: Transabdominal ultrasound examination of the pelvis was performed
including evaluation of the uterus, ovaries, adnexal regions, and
pelvic cul-de-sac. Patient declined transvaginal study.

[Series 1: us pelvis complete · 0.28mm/px · 14 of 37 slices shown]
[im 1/37]
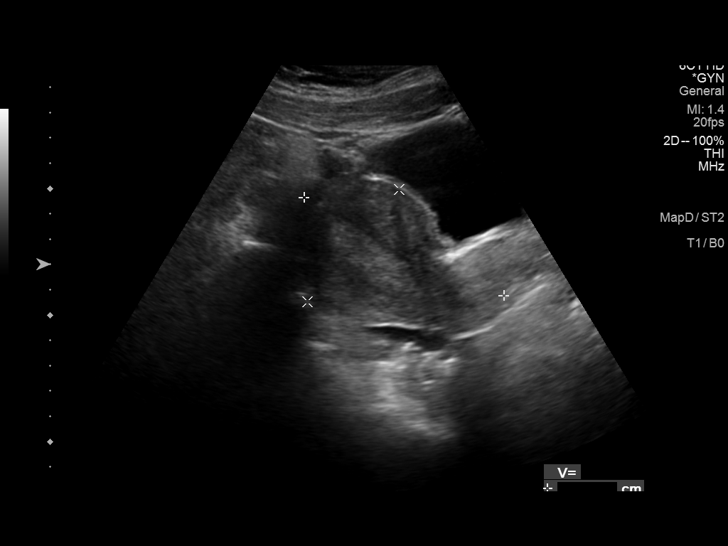
[im 4/37]
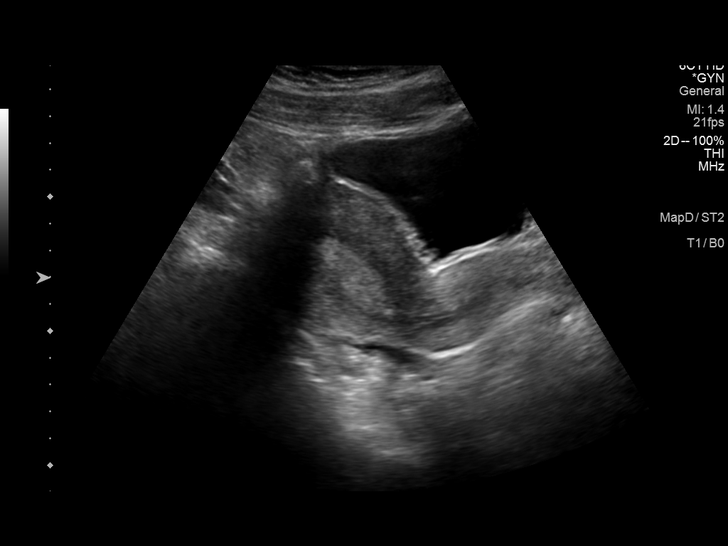
[im 7/37]
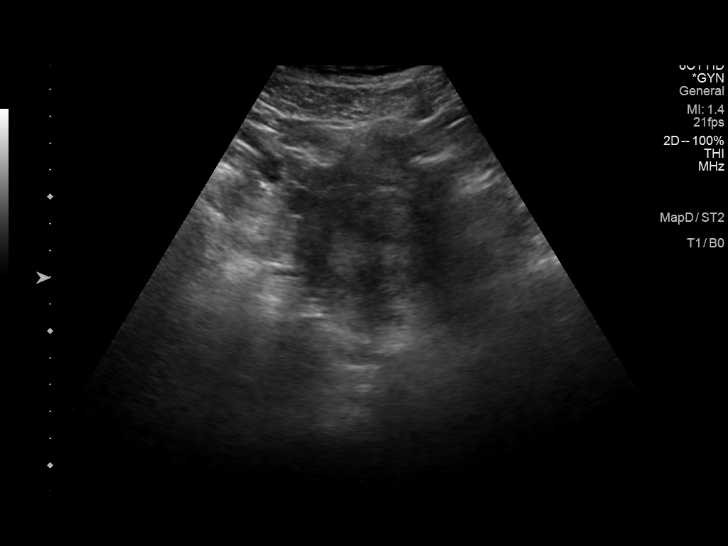
[im 10/37]
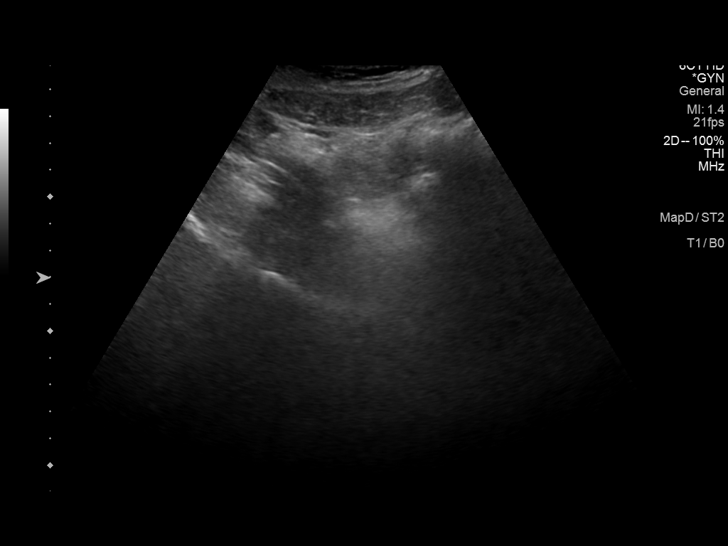
[im 13/37]
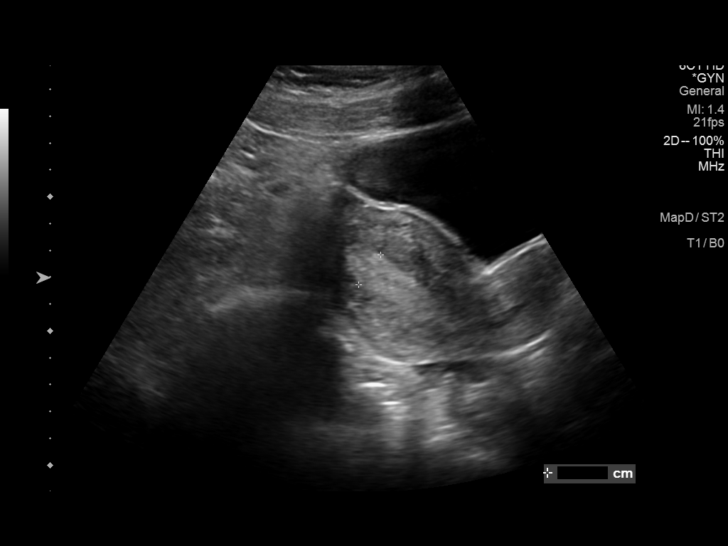
[im 14/37]
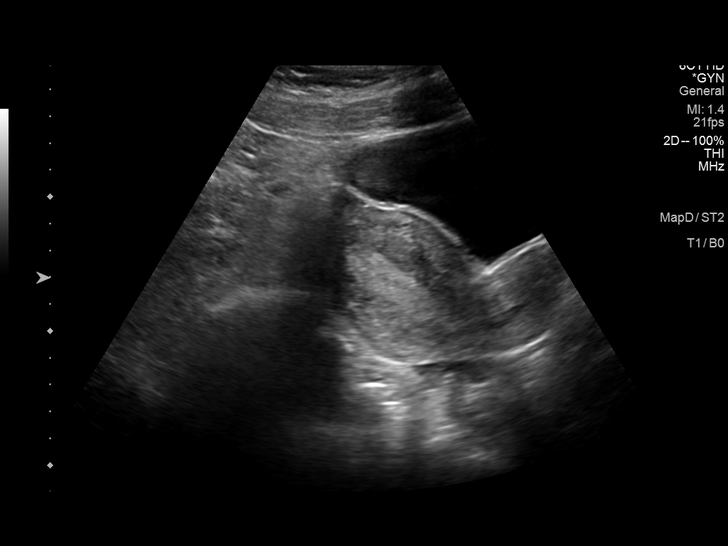
[im 17/37]
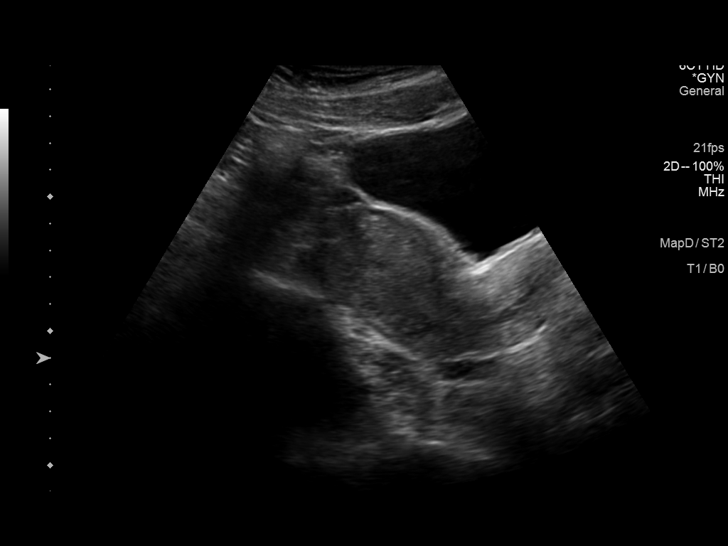
[im 20/37]
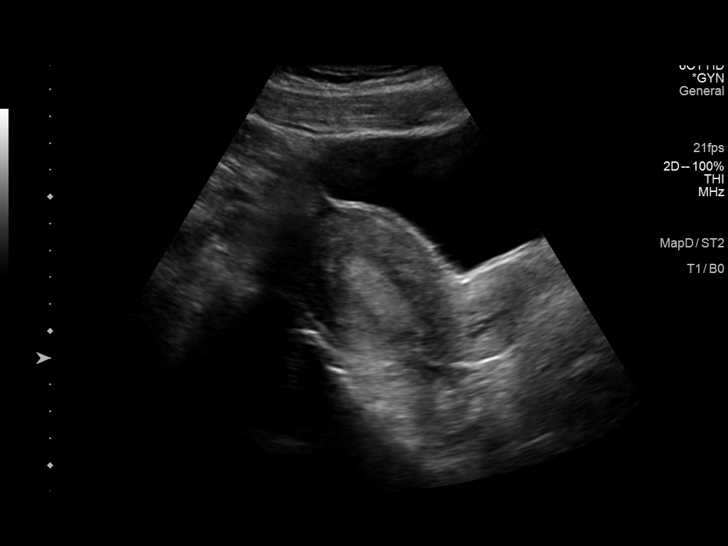
[im 23/37]
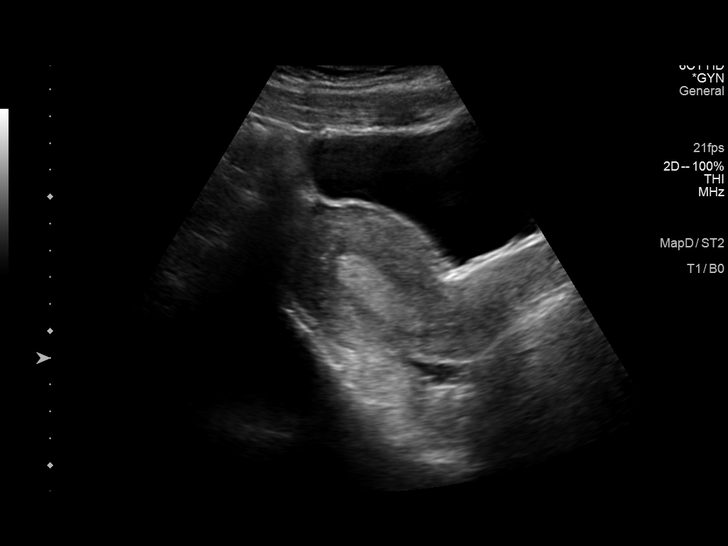
[im 25/37]
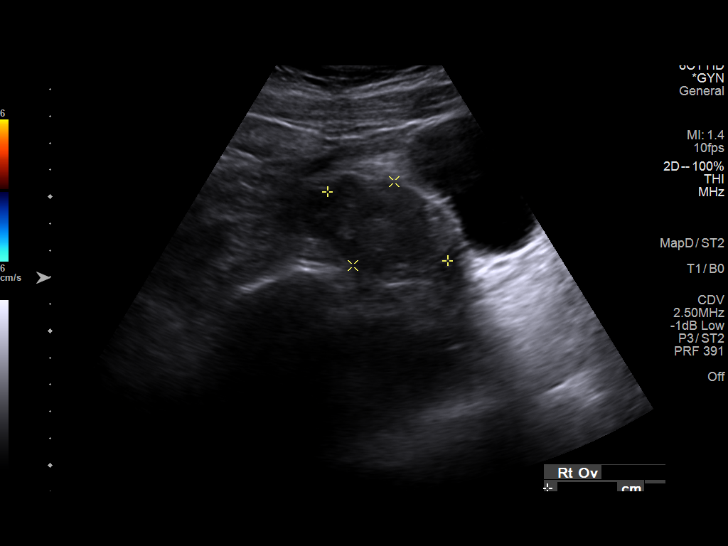
[im 28/37]
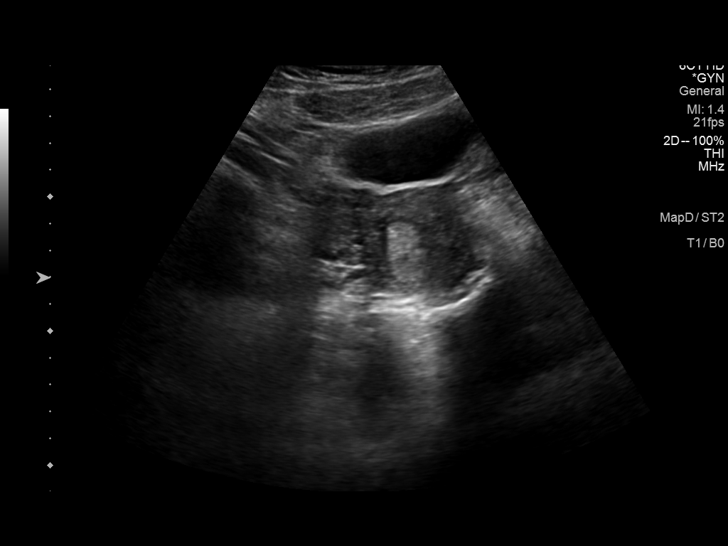
[im 31/37]
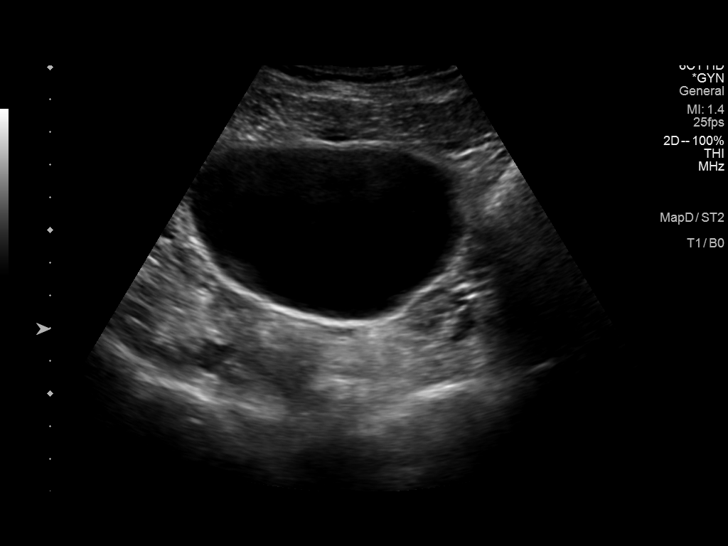
[im 34/37]
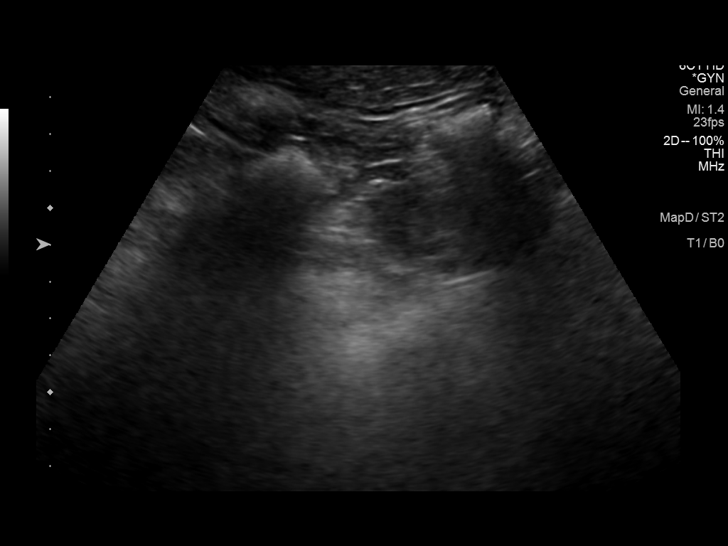
[im 37/37]
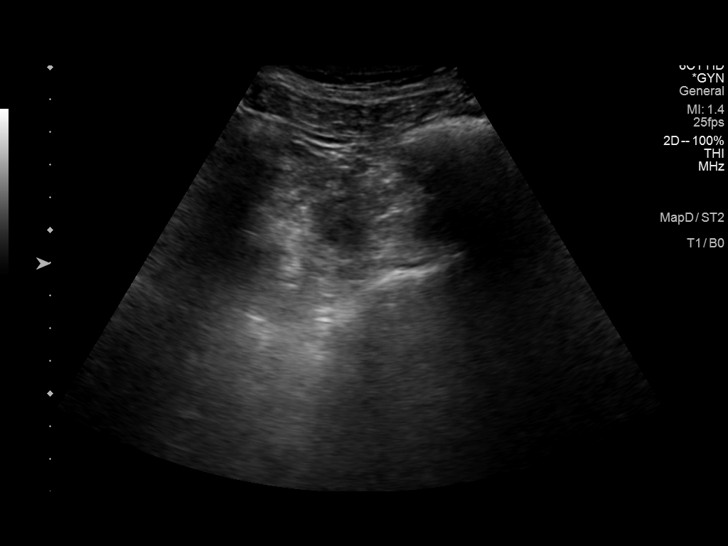

[14 of 25 positions shown; findings below may reference images not displayed]

FINDINGS: Uterus

Measurements: 8.8 x 5.7 x 4.6 cm = volume: 121.6 mL. No fibroids or
other mass visualized. Uterus anteverted.

Endometrium

Thickness: 14 mm. No focal abnormality visualized. Endometrial
contour smooth.

Right ovary

Measurements: 5.2 x 3.5 x 2.0 cm = volume: 19.1 mL. Normal
appearance/no adnexal mass.

Left ovary

Measurements: 3.1 x 1.7 x1.9 cm = volume: 5.0 mL. Normal
appearance/no adnexal mass.

Other findings:  No abnormal free fluid.
IMPRESSION: Study within normal limits.

## 2021-06-13 ENCOUNTER — Encounter (HOSPITAL_BASED_OUTPATIENT_CLINIC_OR_DEPARTMENT_OTHER): Payer: Self-pay | Admitting: Urology

## 2021-06-13 ENCOUNTER — Other Ambulatory Visit: Payer: Self-pay

## 2021-06-13 ENCOUNTER — Emergency Department (HOSPITAL_BASED_OUTPATIENT_CLINIC_OR_DEPARTMENT_OTHER)
Admission: EM | Admit: 2021-06-13 | Discharge: 2021-06-13 | Disposition: A | Discharge status: Home / Self Care | Payer: BC Managed Care – PPO | Attending: Emergency Medicine | Admitting: Emergency Medicine

## 2021-06-13 ENCOUNTER — Emergency Department (HOSPITAL_BASED_OUTPATIENT_CLINIC_OR_DEPARTMENT_OTHER): Payer: BC Managed Care – PPO | Admitting: Radiology

## 2021-06-13 DIAGNOSIS — M25512 Pain in left shoulder: Secondary | ICD-10-CM | POA: Diagnosis not present

## 2021-06-13 DIAGNOSIS — R202 Paresthesia of skin: Secondary | ICD-10-CM | POA: Insufficient documentation

## 2021-06-13 DIAGNOSIS — U071 COVID-19: Secondary | ICD-10-CM | POA: Diagnosis not present

## 2021-06-13 DIAGNOSIS — M25511 Pain in right shoulder: Secondary | ICD-10-CM | POA: Diagnosis not present

## 2021-06-13 DIAGNOSIS — R0602 Shortness of breath: Secondary | ICD-10-CM | POA: Diagnosis not present

## 2021-06-13 DIAGNOSIS — R079 Chest pain, unspecified: Secondary | ICD-10-CM | POA: Diagnosis not present

## 2021-06-13 LAB — CBC
HCT: 36.7 % (ref 36.0–46.0)
Hemoglobin: 12 g/dL (ref 12.0–15.0)
MCH: 30.5 pg (ref 26.0–34.0)
MCHC: 32.7 g/dL (ref 30.0–36.0)
MCV: 93.1 fL (ref 80.0–100.0)
Platelets: 345 10*3/uL (ref 150–400)
RBC: 3.94 MIL/uL (ref 3.87–5.11)
RDW: 12.4 % (ref 11.5–15.5)
WBC: 3 10*3/uL — ABNORMAL LOW (ref 4.0–10.5)
nRBC: 0 % (ref 0.0–0.2)

## 2021-06-13 LAB — RESP PANEL BY RT-PCR (FLU A&B, COVID) ARPGX2
Influenza A by PCR: NEGATIVE
Influenza B by PCR: NEGATIVE
SARS Coronavirus 2 by RT PCR: POSITIVE — AB

## 2021-06-13 LAB — BASIC METABOLIC PANEL
Anion gap: 6 (ref 5–15)
BUN: 9 mg/dL (ref 6–20)
CO2: 27 mmol/L (ref 22–32)
Calcium: 8.5 mg/dL — ABNORMAL LOW (ref 8.9–10.3)
Chloride: 104 mmol/L (ref 98–111)
Creatinine, Ser: 0.62 mg/dL (ref 0.44–1.00)
GFR, Estimated: >60 mL/min (ref >60)
Glucose, Bld: 84 mg/dL (ref 70–99)
Potassium: 3.9 mmol/L (ref 3.5–5.1)
Sodium: 137 mmol/L (ref 135–145)

## 2021-06-13 LAB — HCG, SERUM, QUALITATIVE: Preg, Serum: NEGATIVE

## 2021-06-13 LAB — D-DIMER, QUANTITATIVE: D-Dimer, Quant: <0.27 ug/mL-FEU (ref 0.00–0.50)

## 2021-06-13 LAB — TROPONIN I (HIGH SENSITIVITY)
Troponin I (High Sensitivity): <2 ng/L (ref <18)
Troponin I (High Sensitivity): <2 ng/L (ref <18)

## 2021-06-13 MED ORDER — KETOROLAC TROMETHAMINE 30 MG/ML IJ SOLN
30.0000 mg | Freq: Once | INTRAMUSCULAR | Status: AC
Start: 2021-06-13 — End: 2021-06-13
  Administered 2021-06-13: 30 mg via INTRAMUSCULAR
  Filled 2021-06-13: qty 1

## 2021-06-13 MED ORDER — IPRATROPIUM-ALBUTEROL 0.5-2.5 (3) MG/3ML IN SOLN
3.0000 mL | Freq: Once | RESPIRATORY_TRACT | Status: AC
  Administered 2021-06-13: 3 mL via RESPIRATORY_TRACT
  Filled 2021-06-13: qty 3

## 2021-06-13 NOTE — ED Triage Notes (Signed)
Shortness of breath, chest pain and tingling in hands and feet since yesterday  Denies Cough, Denies fever  NAD now A&O x 4

## 2021-06-13 NOTE — ED Provider Notes (Signed)
[facility] EMERGENCY DEPT Provider Note   CSN: [id number] Arrival date & time: 06/13/21  1321     History  Chief Complaint  Patient presents with   Chest Pain   Shortness of Breath    Traci Finley is a 31 y.o. female.   Chest Pain Associated symptoms: shortness of breath   Shortness of Breath Associated symptoms: chest pain    30 year old female presenting to the Emergency Department with chest pain, shortness of breath and tingling in the hands and feet since yesterday.  She denies any productive cough.  She denies any fevers.  She does endorse mild chills.  She states that symptoms have been present for just over 1 day.  Her chest pain is described as pleuritic in nature.  It is located in the left-sided and radiates slightly to her back.  She is not sure if it is musculoskeletal as she has been lifting heavy items for work recently.  Home Medications Prior to Admission medications   Medication Sig Start Date End Date Taking? Authorizing Provider  cholecalciferol (VITAMIN D3) 25 MCG (1000 UNIT) tablet Take 1,000 Units by mouth daily.    [provider]  glucosamine-chondroitin 500-400 MG tablet Take 1 tablet by mouth 3 (three) times daily.    [provider]  levocetirizine (XYZAL) 5 MG tablet Take 1 tablet (5 mg total) by mouth every evening. 01/28/21   Hoyt Koch, MD  Turmeric 1053 MG TABS Take by mouth.    [provider]      Allergies    Patient has no known allergies.    Review of Systems   Review of Systems  Respiratory:  Positive for shortness of breath.   Cardiovascular:  Positive for chest pain.  Musculoskeletal:  Positive for arthralgias.  All other systems reviewed and are negative.  Physical Exam Updated Vital Signs BP 117/76 (BP Location: Right Arm)    Pulse 66    Temp 97.7 F (36.5 C) (Oral)    Resp 16    Ht 5\' 5"  (1.651 m)    Wt 86.2 kg    LMP 05/27/2021 (Approximate)    SpO2 100%    BMI 31.62  kg/m  Physical Exam Vitals and nursing note reviewed.  Constitutional:      General: She is not in acute distress.    Appearance: She is well-developed.  HENT:     Head: Normocephalic and atraumatic.  Eyes:     Conjunctiva/sclera: Conjunctivae normal.     Pupils: Pupils are equal, round, and reactive to light.  Cardiovascular:     Rate and Rhythm: Normal rate and regular rhythm.     Heart sounds: No murmur heard. Pulmonary:     Effort: Pulmonary effort is normal. No respiratory distress.     Breath sounds: Normal breath sounds.  Chest:     Comments: Mild tenderness to palpation about the left chest wall  Abdominal:     General: There is no distension.     Palpations: Abdomen is soft.     Tenderness: There is no abdominal tenderness. There is no guarding.  Musculoskeletal:        General: No swelling, deformity or signs of injury.     Cervical back: Neck supple.     Comments: Mild tenderness about the distal left clavicle.  Left upper extremity neurovascularly intact with intact range of motion of the left shoulder.  Skin:    General: Skin is warm and dry.     Capillary  Refill: Capillary refill takes less than 2 seconds.     Findings: No lesion or rash.  Neurological:     General: No focal deficit present.     Mental Status: She is alert. Mental status is at baseline.  Psychiatric:        Mood and Affect: Mood normal.    ED Results / Procedures / Treatments   Labs (all labs ordered are listed, but only abnormal results are displayed) Labs Reviewed  RESP PANEL BY RT-PCR (FLU A&B, COVID) ARPGX2 - Abnormal; Notable for the following components:      Result Value   SARS Coronavirus 2 by RT PCR POSITIVE (*)    All other components within normal limits  BASIC METABOLIC PANEL - Abnormal; Notable for the following components:   Calcium 8.5 (*)    All other components within normal limits  CBC - Abnormal; Notable for the following components:   WBC 3.0 (*)    All other  components within normal limits  HCG, SERUM, QUALITATIVE  D-DIMER, QUANTITATIVE  TROPONIN I (HIGH SENSITIVITY)  TROPONIN I (HIGH SENSITIVITY)    EKG EKG Interpretation  Date/Time:  Monday June 13 2021 13:28:50 EST Ventricular Rate:  74 PR Interval:  162 QRS Duration: 78 QT Interval:  370 QTC Calculation: 410 R Axis:   34 Text Interpretation: Normal sinus rhythm with sinus arrhythmia Low voltage QRS Cannot rule out Anterior infarct , age undetermined Abnormal ECG When compared with ECG of 09-Dec-2018 20:13, PREVIOUS ECG IS PRESENT Confirmed by Regan Lemming ([id number]) on 06/13/2021 3:07:24 PM  Radiology DG Chest 2 View  Result Date: 06/13/2021 CLINICAL DATA:  Chest pain for the past 2 days. EXAM: CHEST - 2 VIEW COMPARISON:  12/09/2018 FINDINGS: Normal sized heart. Clear lungs with normal vascularity. Unremarkable bones. Cholecystectomy clips. IMPRESSION: No acute abnormality. Electronically Signed   By: Claudie Revering M.D.   On: 06/13/2021 13:43    Procedures Procedures    Medications Ordered in ED Medications  ketorolac (TORADOL) 30 MG/ML injection 30 mg (30 mg Intramuscular Given 06/13/21 1537)  ipratropium-albuterol (DUONEB) 0.5-2.5 (3) MG/3ML nebulizer solution 3 mL (3 mLs Nebulization Given 06/13/21 1558)    ED Course/ Medical Decision Making/ A&P Clinical Course as of 06/14/21 1333  Mon Jun 13, 2021  1651 SARS Coronavirus 2 by RT PCR(!): POSITIVE [JL]    Clinical Course User Index [JL] Regan Lemming, MD                           Medical Decision Making Amount and/or Complexity of Data Reviewed Labs: ordered. Decision-making details documented in ED Course. Radiology: ordered.  Risk Prescription drug management.   31 year old female presenting to the Emergency Department with chest pain, shortness of breath and tingling in the hands and feet since yesterday.  She denies any productive cough.  She denies any fevers.  She does endorse mild chills.  She states that  symptoms have been present for just over 1 day.  Her chest pain is described as pleuritic in nature.  It is located in the left-sided and radiates slightly to her back.  She is not sure if it is musculoskeletal as she has been lifting heavy items for work recently.  On arrival, the patient was afebrile, hemodynamically stable, saturating well on room air.  The patient had normal sinus rhythm noted on cardiac telemetry.  She presents with some pleuritic chest pain.  She is PERC positive because she did endorse  some asymmetric swelling to her legs recently.  A D-dimer was ordered and was negative.  Low concern for PE.  Low concern for ACS.  Considered pneumonia, viral URI, COVID-19 and influenza.  Additionally considered musculoskeletal pain for which the patient was administered Toradol with some improvement.   The patient did test positive for COVID-19 by PCR testing.  An EKG was performed which revealed no acute ischemic changes.  A chest x-ray revealed no focal consolidation to suggest a pneumonia.  Remainder of the patient's laboratory work-up was significant for normal serum pregnancy, negative troponin, BMP that was unremarkable, CBC with a leukopenia to 3.0.  The patient was ambulated in the emergency department and had no desaturations on pulse oximetry.  Suspect some combination of musculoskeletal pain in the setting of recent heavy lifting.  Tenderness about the distal clavicle consistent with possible type I AC separation.  She was provided a sling for comfort.  Advised to follow-up with her PCP in 1 week for reassessment.  She was offered Paxlovid for treatment and declined the medication.  She is overall stable for discharge home.  DC: Your exam/testing today was overall reassuring.  Recommend NSAIDs for pain control and shoulder sling for comfort, light duty at work and isolation for the next 5 days given your positive test result for COVID-19.   Final Clinical Impression(s) / ED  Diagnoses Final diagnoses:  Acute pain of left shoulder  COVID-19    Rx / DC Orders ED Discharge Orders     None         Regan Lemming, MD 06/14/21 1334

## 2021-06-13 NOTE — Discharge Instructions (Addendum)
You were evaluated in the Emergency Department and after careful evaluation, we did not find any emergent condition requiring admission or further testing in the hospital.  Your exam/testing today was overall reassuring.  Recommend NSAIDs for pain control and shoulder sling for comfort, light duty at work and isolation for the next 5 days given your positive test result for COVID-19.  Please return to the Emergency Department if you experience any worsening of your condition.  Thank you for allowing [place] to be a part of your care.

## 2021-06-14 ENCOUNTER — Telehealth: Payer: Self-pay | Admitting: Internal Medicine

## 2021-06-14 NOTE — Telephone Encounter (Signed)
Pt was seen in the ED.  

## 2021-06-14 NOTE — Telephone Encounter (Signed)
Connected to [facility] 1.30.2023.  caller states having tingling tips of fingers, toes, shortness of breath, chest pain, nasal congestion, body aches office requesting triage sees Dr Okey Dupre. Wed started with congestion. Tingling comes and goes randomly, last 2 days, more often. CP to the left side of chest,started Thursday night. left shoulder, some right shoulder. SOB started for a month.  Advised to go to ED.

## 2021-08-17 NOTE — Progress Notes (Signed)
? ?   Traci Finley D.Judd Gaudier ?[facility] ?[address] [place] [address] ?Phone: [phone] ?  ?Assessment and Plan:   ?  ?1. Acute on chronic pain of right shoulder ?2. Tendinitis of right rotator cuff ?-Chronic with exacerbation, initial sports medicine visit ?- Chronic and intermittent bilateral shoulder pain with acute flare of right shoulder pain x2 days, likely due to sleep positioning and physical activity, consistent with subacromial bursitis versus rotator cuff tendinopathy ?- Start meloxicam 15 mg daily x2 weeks.  If still having pain after 2 weeks, complete 3rd-week of meloxicam. May use remaining meloxicam as needed once daily for pain control.  Do not to use additional NSAIDs while taking meloxicam.  May use Tylenol [phone] mg 2 to 3 times a day for breakthrough pain. ?- Start HEP for shoulder strengthening and stretching ?  ?Pertinent previous records reviewed include ER note from 06/13/2021 ?  ?Follow Up: 3 weeks for reevaluation.  Could consider formal PT versus x-ray imaging versus subacromial CSI based on symptoms ?  ?Subjective:   ?I, Jerene Canny, am serving as a Neurosurgeon for [employer] ? ?Chief Complaint: right shoulder pain  ? ?HPI:  ? ?08/18/21 ?Patient is a 31 year old female complaining of right shoulder pain. Patient states that yesterday she woke up and had decreased ROM and wasn't able to lift with out pain, she does go to the gym a couple of days out the week and states it doesn't feel like a sore its a sharp pain as soon as she moves it, Tuesday night she had numbness and tingling , pain radiates up to the neck , Tuesday night it felt like a pull to her bicep, there is always a pulling sensation in the bicep area she says its due to age, has not been taking meds, no heat or ice  ? ?Relevant Historical Information: None pertinent ? ?Additional pertinent review of systems negative. ? ? ?Current Outpatient Medications:  ?  cholecalciferol  (VITAMIN D3) 25 MCG (1000 UNIT) tablet, Take 1,000 Units by mouth daily., Disp: , Rfl:  ?  glucosamine-chondroitin 500-400 MG tablet, Take 1 tablet by mouth 3 (three) times daily., Disp: , Rfl:  ?  levocetirizine (XYZAL) 5 MG tablet, Take 1 tablet (5 mg total) by mouth every evening., Disp: 30 tablet, Rfl: 0 ?  meloxicam (MOBIC) 15 MG tablet, Take 1 tablet (15 mg total) by mouth daily., Disp: 30 tablet, Rfl: 0 ?  Turmeric 1053 MG TABS, Take by mouth., Disp: , Rfl:   ? ?Objective:   ?  ?Vitals:  ? 08/18/21 0805  ?BP: 110/80  ?Pulse: 82  ?SpO2: 99%  ?Weight: 197 lb (89.4 kg)  ?Height: 5\' 5"  (1.651 m)  ?  ?  ?Body mass index is 32.78 kg/m?.  ?  ?Physical Exam:   ? ?Gen: Appears well, nad, nontoxic and pleasant ?Neuro:sensation intact, strength is 5/5 with df/pf/inv/ev, muscle tone wnl ?Skin: no suspicious lesion or defmority ?Psych: A&O, appropriate mood and affect ? ?Shoulder: no deformity, swelling or muscle wasting ?No scapular winging ?FF 180, abd 90, int 20, ext 70 ?TTP mildly clavicle, AC ?NTTP over the Sunrise,  coracoid, biceps groove, humerus, deltoid, trapezius, cervical spine ?Positive Neer, Hawkins, speeds ?Neg   empty can, subscap liftoff,  , obriens, crossarm ?Neg ant drawer, sulcus sign, apprehension ?Negative Spurling's test bilat ?FROM of neck  ? ? ?Electronically signed by:  ? D.Traci Finley ?[facility] ?8:23 AM 08/18/21 ?

## 2021-08-18 ENCOUNTER — Ambulatory Visit: Payer: BC Managed Care – PPO | Admitting: Sports Medicine

## 2021-08-18 VITALS — BP 110/80 | HR 82 | Ht 65.0 in | Wt 197.0 lb

## 2021-08-18 DIAGNOSIS — M25511 Pain in right shoulder: Secondary | ICD-10-CM

## 2021-08-18 DIAGNOSIS — M7581 Other shoulder lesions, right shoulder: Secondary | ICD-10-CM

## 2021-08-18 MED ORDER — MELOXICAM 15 MG PO TABS: 15.0000 mg | ORAL_TABLET | Freq: Every day | ORAL | 0 refills | Status: DC

## 2021-08-18 NOTE — Patient Instructions (Addendum)
Good to see you  Shoulder HEP  - Start meloxicam 15 mg daily x2 weeks.  If still having pain after 2 weeks, complete 3rd-week of meloxicam. May use remaining meloxicam as needed once daily for pain control.  Do not to use additional NSAIDs while taking meloxicam.  May use Tylenol 500-1000 mg 2 to 3 times a day for breakthrough pain. 3 week follow up  

## 2021-09-07 NOTE — Progress Notes (Signed)
? ?   Traci Finley D.Judd Gaudier ?[facility] ?[address] [place] [address] ?Phone: [phone] ?  ?Assessment and Plan:   ?  ?1. Chronic right shoulder pain ?-Chronic with exacerbation, subsequent visit ?- Significant improvement of acute on chronic flare of right shoulder pain with HEP and 3-week course of meloxicam ?- Discontinue daily meloxicam and may use remainder as needed ?- Use Tylenol as needed for day-to-day pain relief ?- Continue HEP ?  ?Pertinent previous records reviewed include none ?  ?Follow Up: As needed if no improvement or worsening of symptoms.  Could consider PT versus x-ray imaging versus subacromial CSI based on symptoms ?  ?Subjective:   ?I, Jerene Canny, am serving as a Neurosurgeon for [employer] ?  ?Chief Complaint: right shoulder pain  ?  ?HPI:  ?  ?08/18/21 ?Patient is a 31 year old female complaining of right shoulder pain. Patient states that yesterday she woke up and had decreased ROM and wasn't able to lift with out pain, she does go to the gym a couple of days out the week and states it doesn't feel like a sore its a sharp pain as soon as she moves it, Tuesday night she had numbness and tingling , pain radiates up to the neck , Tuesday night it felt like a pull to her bicep, there is always a pulling sensation in the bicep area she says its due to age, has not been taking meds, no heat or ice  ? ?09/08/2021 ?Patient states that she is a lot better than last time , still has some limited mobility and some flare ups of pain here and there.  ? ? ?Relevant Historical Information: None pertinent ? ?Additional pertinent review of systems negative. ? ? ?Current Outpatient Medications:  ?  cholecalciferol (VITAMIN D3) 25 MCG (1000 UNIT) tablet, Take 1,000 Units by mouth daily., Disp: , Rfl:  ?  glucosamine-chondroitin 500-400 MG tablet, Take 1 tablet by mouth 3 (three) times daily., Disp: , Rfl:  ?  levocetirizine (XYZAL) 5 MG tablet, Take 1 tablet  (5 mg total) by mouth every evening., Disp: 30 tablet, Rfl: 0 ?  meloxicam (MOBIC) 15 MG tablet, Take 1 tablet (15 mg total) by mouth daily., Disp: 30 tablet, Rfl: 0 ?  Turmeric 1053 MG TABS, Take by mouth., Disp: , Rfl:   ? ?Objective:   ?  ?Vitals:  ? 09/08/21 0808  ?BP: 110/80  ?Pulse: 75  ?SpO2: 99%  ?Weight: 198 lb (89.8 kg)  ?Height: 5\' 5"  (1.651 m)  ?  ?  ?Body mass index is 32.95 kg/m?.  ?  ?Physical Exam:   ? ?Gen: Appears well, nad, nontoxic and pleasant ?Neuro:sensation intact, strength is 5/5 with df/pf/inv/ev, muscle tone wnl ?Skin: no suspicious lesion or defmority ?Psych: A&O, appropriate mood and affect ? ?Right shoulder: no deformity, swelling or muscle wasting ?No scapular winging ?FF 180, abd 180, int 0, ext 90 ?NTTP over the Guy, clavicle, ac, coracoid, biceps groove, humerus, deltoid, trapezius, cervical spine ?Neg neer, hawkings, empty can, subscap liftoff, speeds, obriens, crossarm ?Neg ant drawer, sulcus sign, apprehension ?Negative Spurling's test bilat ?FROM of neck  ? ? ?Electronically signed by:  ? D.Traci Finley ?[facility] ?8:17 AM 09/08/21 ?

## 2021-09-08 ENCOUNTER — Ambulatory Visit: Payer: BC Managed Care – PPO | Admitting: Sports Medicine

## 2021-09-08 VITALS — BP 110/80 | HR 75 | Ht 65.0 in | Wt 198.0 lb

## 2021-09-08 DIAGNOSIS — M25511 Pain in right shoulder: Secondary | ICD-10-CM

## 2021-09-08 DIAGNOSIS — G8929 Other chronic pain: Secondary | ICD-10-CM | POA: Diagnosis not present

## 2021-09-08 NOTE — Patient Instructions (Addendum)
Good to see you  ?As needed follow up  ?

## 2021-11-24 DIAGNOSIS — K581 Irritable bowel syndrome with constipation: Secondary | ICD-10-CM | POA: Diagnosis not present

## 2021-11-24 DIAGNOSIS — R140 Abdominal distension (gaseous): Secondary | ICD-10-CM | POA: Diagnosis not present

## 2021-11-24 DIAGNOSIS — R1030 Lower abdominal pain, unspecified: Secondary | ICD-10-CM | POA: Diagnosis not present

## 2022-07-30 IMAGING — US US THYROID
1 series · 14 of 25 positions shown · non-contrast
Comparison: None.

CLINICAL DATA: 29-year-old female with a history of enlarged
thyroid

EXAM:
THYROID ULTRASOUND
TECHNIQUE: Ultrasound examination of the thyroid gland and adjacent soft
tissues was performed.

[Series 1: us thyroid · 0.06mm/px · 14 of 30 slices shown]
[im 1/30]
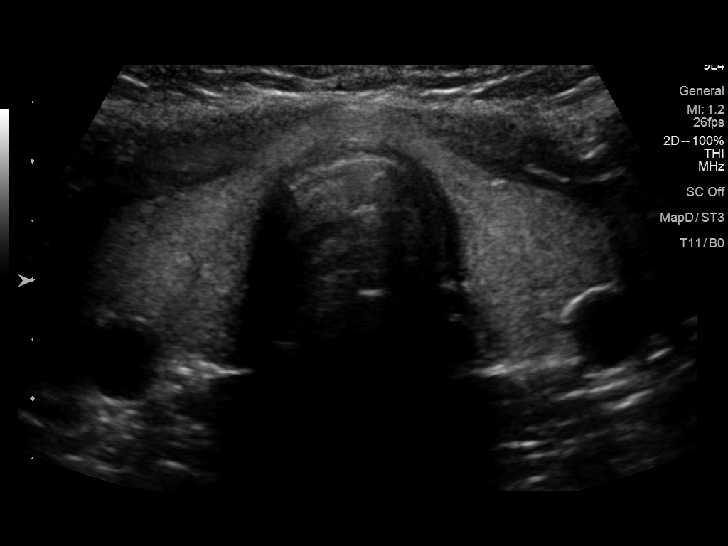
[im 3/30]
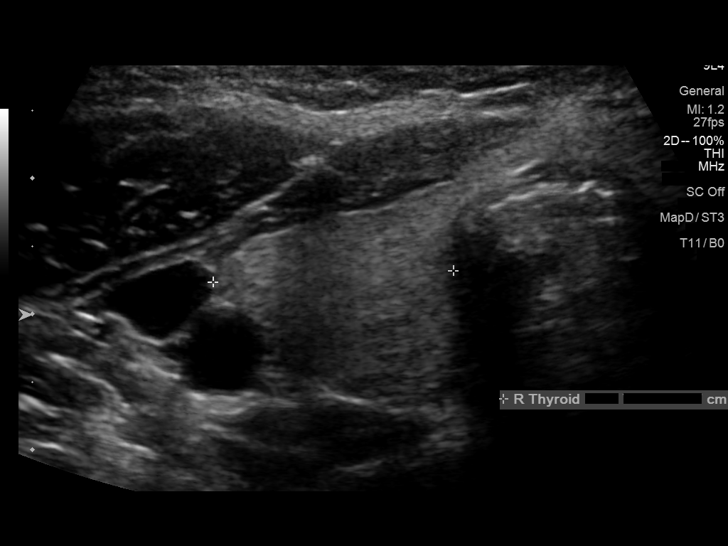
[im 5/30]
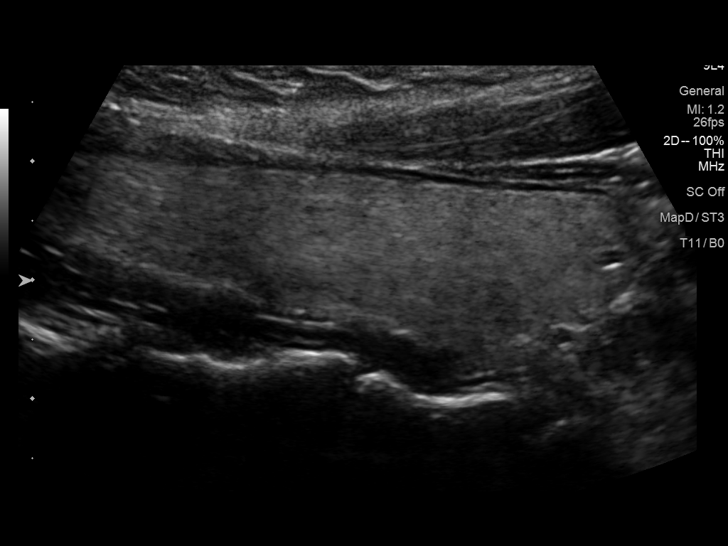
[im 8/30]
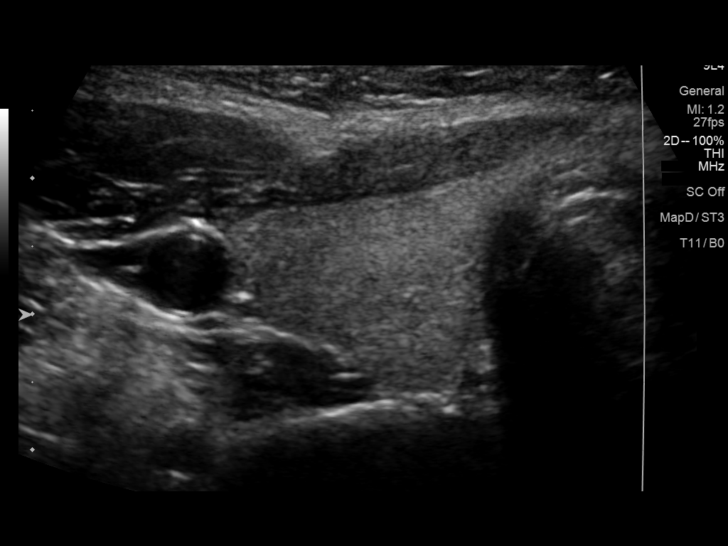
[im 10/30]
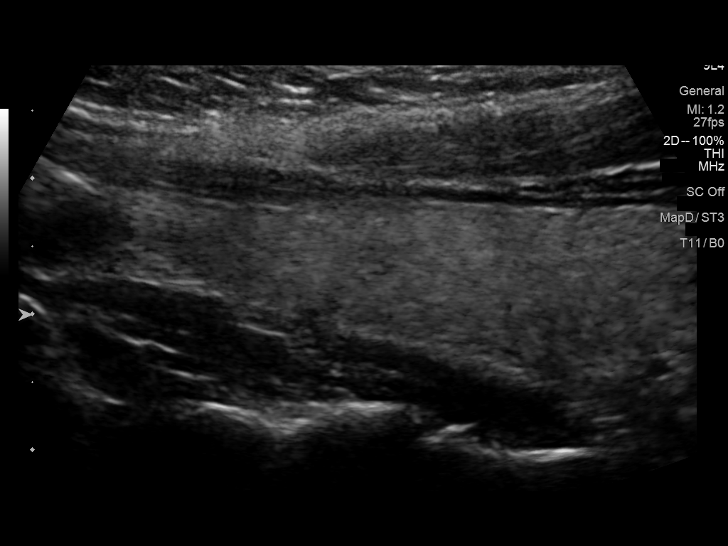
[im 11/30]
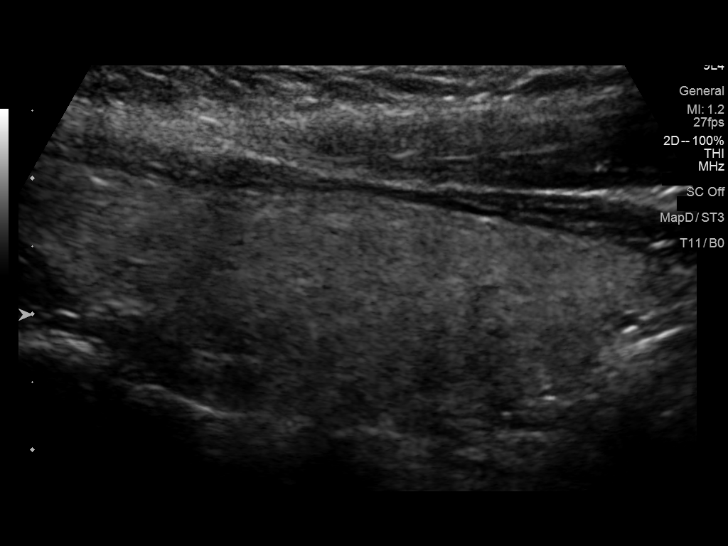
[im 14/30]
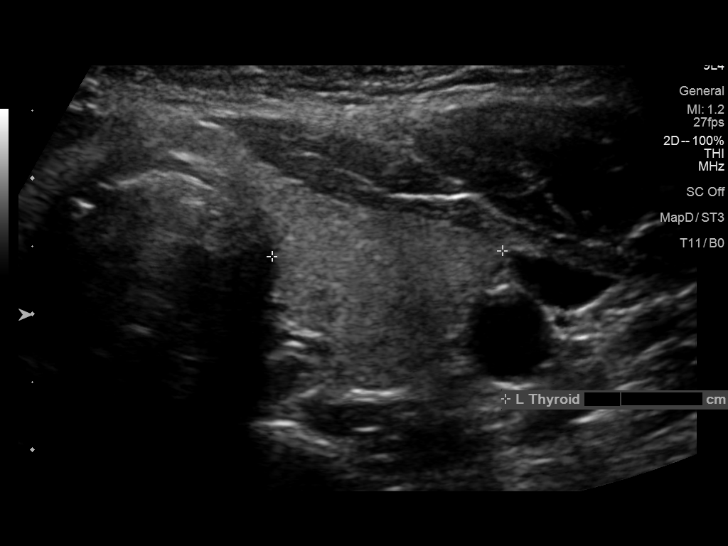
[im 16/30]
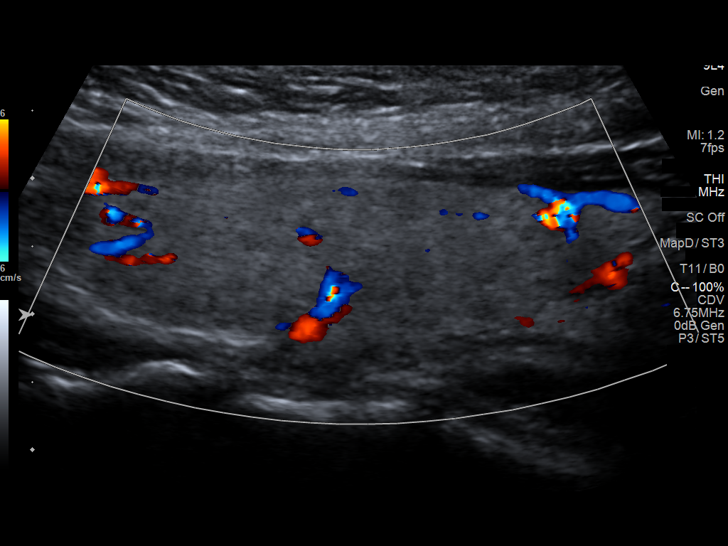
[im 19/30]
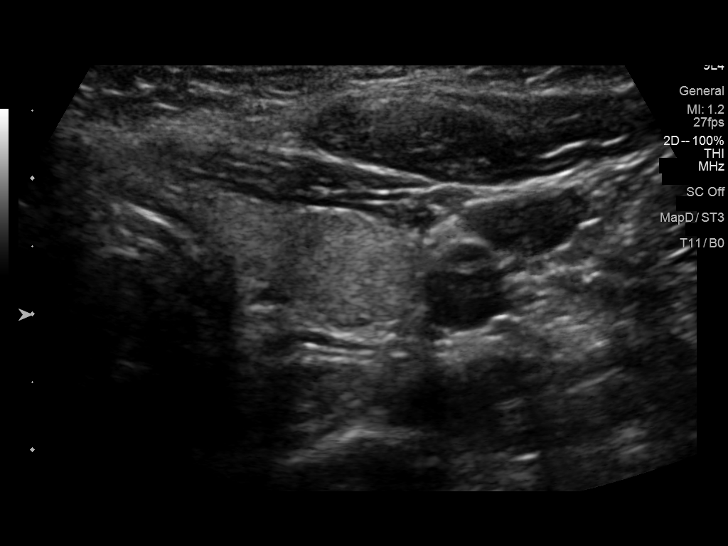
[im 20/30]
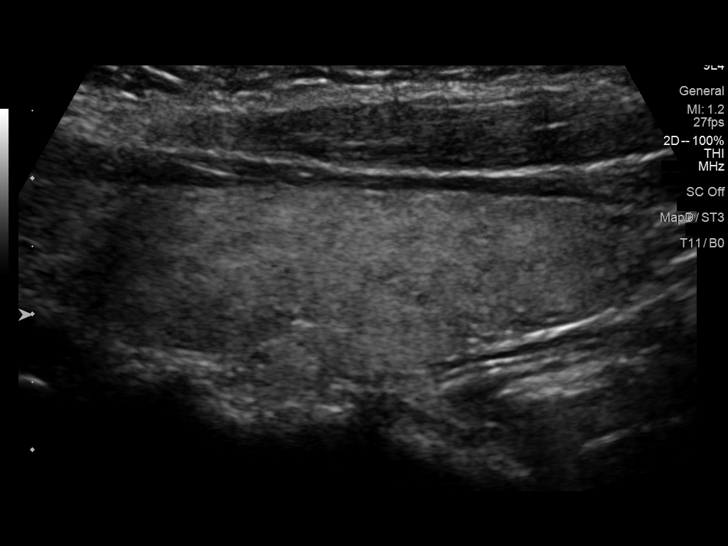
[im 22/30]
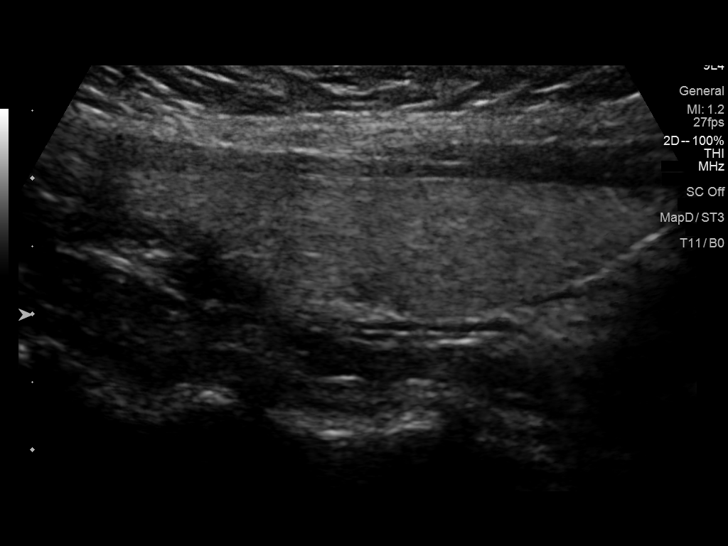
[im 25/30]
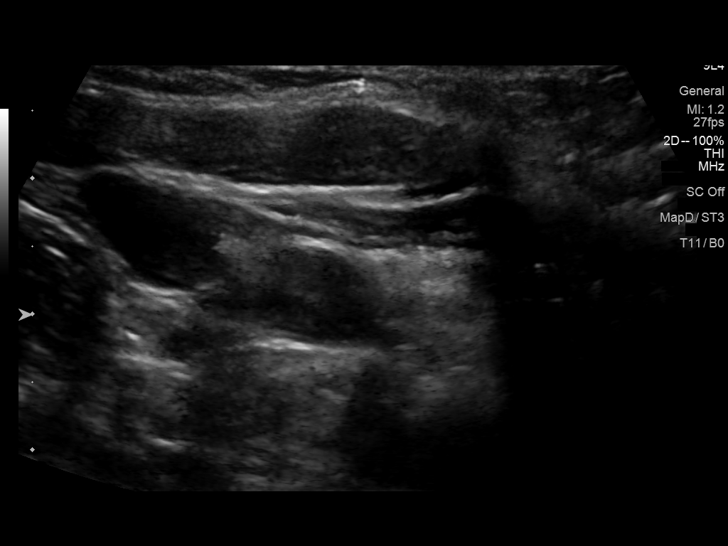
[im 27/30]
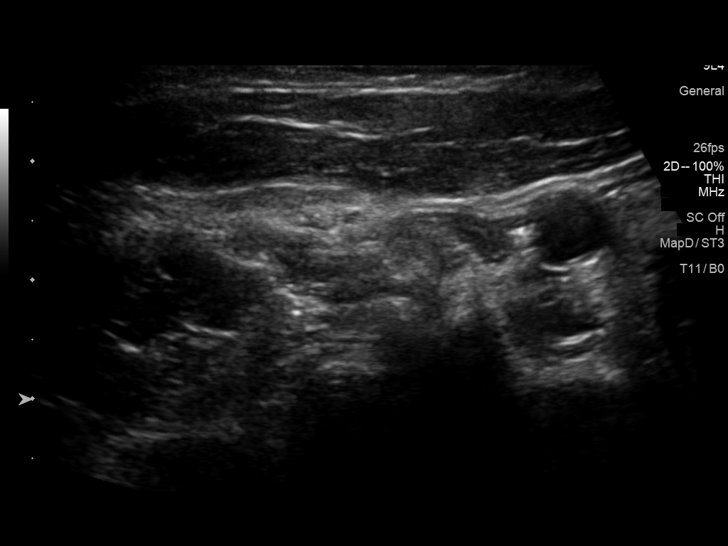
[im 30/30]
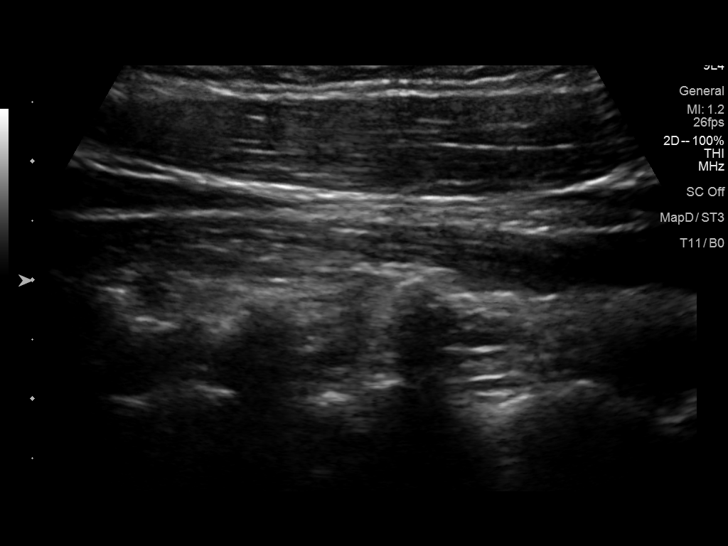

[14 of 25 positions shown; findings below may reference images not displayed]

FINDINGS: Parenchymal Echotexture: Normal

Isthmus: 0.4 cm

Right lobe: 5.2 cm x 1.5 cm x 1.8 cm

Left lobe: 5.0 cm x 1.5 cm x 1.7 cm

_________________________________________________________

Estimated total number of nodules >/= 1 cm: 0

Number of spongiform nodules >/=  2 cm not described below (TR1): 0

Number of mixed cystic and solid nodules >/= 1.5 cm not described
below (TR2): 0

_________________________________________________________

No discrete nodules are seen within the thyroid gland.
IMPRESSION: Relatively unremarkable sonographic survey of the thyroid

## 2022-08-31 ENCOUNTER — Encounter: Payer: Self-pay | Admitting: Family Medicine

## 2022-08-31 ENCOUNTER — Ambulatory Visit: Payer: No Typology Code available for payment source | Admitting: Family Medicine

## 2022-08-31 VITALS — BP 114/80 | HR 70 | Temp 97.8°F | Ht 65.0 in | Wt 210.0 lb

## 2022-08-31 DIAGNOSIS — J22 Unspecified acute lower respiratory infection: Secondary | ICD-10-CM | POA: Diagnosis not present

## 2022-08-31 DIAGNOSIS — R051 Acute cough: Secondary | ICD-10-CM

## 2022-08-31 DIAGNOSIS — J302 Other seasonal allergic rhinitis: Secondary | ICD-10-CM

## 2022-08-31 MED ORDER — AZITHROMYCIN 250 MG PO TABS: ORAL_TABLET | ORAL | 0 refills | Status: AC

## 2022-08-31 MED ORDER — BENZONATATE 200 MG PO CAPS: 200.0000 mg | ORAL_CAPSULE | Freq: Two times a day (BID) | ORAL | 0 refills | Status: DC | PRN

## 2022-08-31 NOTE — Patient Instructions (Signed)
Take the antibiotic as prescribed.  Stay well-hydrated.  You can continue Mucinex DM twice daily.  Take the prescribed Tessalon Perles as needed for cough  Follow-up if you are not improving in the next 3 to 4 days or if you are getting worse or have any new symptoms.

## 2022-08-31 NOTE — Progress Notes (Signed)
Subjective: Chief Complaint  Patient presents with   Cough    Dry cough started a week ago, starting to get worse. So bad she is throwing up because it is so violent, becoming hard to breathe and heart is "coming out of her chest". Worse at night   itchy eyes    Started 2 weeks ago, accompanied by stuffy nose. Using Claritin      Traci Finley is a 32 y.o. female who presents for a 2 wk hx of URI symptoms. Initially she had only her usual allergy symptoms. Then started having a NP cough which is now severe, worse at night, coughing to the point of gagging and vomiting. Chest is sore. Having a hard time catching her breath. Unable to exercise.  Does not smoke. No recent antibiotics.  Childhood asthma.   Denies fever, chills, dizziness, palpitations, shortness of breath, nausea, diarrhea.  No wheezing.   Claritin, Mucinex DM.   Denies sick contacts.  No other aggravating or relieving factors.  No other c/o.  ROS as in subjective.   Objective: Vitals:   08/31/22 1306  BP: 114/80  Pulse: 70  Temp: 97.8 F (36.6 C)  SpO2: 98%    General appearance: Alert, WD/WN, no distress, mildly ill appearing                             Skin: warm, no rash                           Head: no sinus tenderness                            Eyes: conjunctiva normal, corneas clear, PERRLA                            Ears: pearly TMs, external ear canals normal                          Nose: septum midline, turbinates swollen, with erythema              Mouth/throat: MMM, tongue normal, mild pharyngeal erythema                           Neck: supple, right cervical adenopathy, no thyromegaly, nontender                          Heart: RRR                         Lungs: CTA bilaterally, no wheezes, rales, or rhonchi      Assessment: Lower respiratory infection - Plan: azithromycin (ZITHROMAX) 250 MG tablet  Acute cough - Plan: benzonatate (TESSALON) 200 MG capsule  Seasonal  allergies   Plan: Suspect bacterial infection. Course of illness is worsening. Azithromycin and Tessalon prescribed.  Suggested symptomatic OTC remedies. Continue allergy medications.  Nasal saline spray for congestion.  Tylenol or Ibuprofen OTC for fever and malaise.  Call/return if worsening or if not improving in 3-4 days.

## 2023-01-08 ENCOUNTER — Ambulatory Visit (INDEPENDENT_AMBULATORY_CARE_PROVIDER_SITE_OTHER): Payer: No Typology Code available for payment source | Admitting: Internal Medicine

## 2023-01-08 ENCOUNTER — Encounter: Payer: Self-pay | Admitting: Internal Medicine

## 2023-01-08 VITALS — BP 114/80 | HR 72 | Temp 98.2°F | Ht 65.0 in | Wt 213.0 lb

## 2023-01-08 DIAGNOSIS — Z0000 Encounter for general adult medical examination without abnormal findings: Secondary | ICD-10-CM

## 2023-01-08 DIAGNOSIS — N920 Excessive and frequent menstruation with regular cycle: Secondary | ICD-10-CM

## 2023-01-08 DIAGNOSIS — E559 Vitamin D deficiency, unspecified: Secondary | ICD-10-CM | POA: Diagnosis not present

## 2023-01-08 DIAGNOSIS — D500 Iron deficiency anemia secondary to blood loss (chronic): Secondary | ICD-10-CM

## 2023-01-08 DIAGNOSIS — E88819 Insulin resistance, unspecified: Secondary | ICD-10-CM

## 2023-01-08 DIAGNOSIS — Z1159 Encounter for screening for other viral diseases: Secondary | ICD-10-CM

## 2023-01-08 LAB — COMPREHENSIVE METABOLIC PANEL
ALT: 12 U/L (ref 0–35)
AST: 13 U/L (ref 0–37)
Albumin: 3.8 g/dL (ref 3.5–5.2)
Alkaline Phosphatase: 70 U/L (ref 39–117)
BUN: 10 mg/dL (ref 6–23)
CO2: 30 meq/L (ref 19–32)
Calcium: 9 mg/dL (ref 8.4–10.5)
Chloride: 103 meq/L (ref 96–112)
Creatinine, Ser: 0.63 mg/dL (ref 0.40–1.20)
GFR: 116.21 mL/min (ref >60.00)
Glucose, Bld: 83 mg/dL (ref 70–99)
Potassium: 4.1 meq/L (ref 3.5–5.1)
Sodium: 140 meq/L (ref 135–145)
Total Bilirubin: 0.6 mg/dL (ref 0.2–1.2)
Total Protein: 6.7 g/dL (ref 6.0–8.3)

## 2023-01-08 LAB — LIPID PANEL
Cholesterol: 126 mg/dL (ref 0–200)
HDL: 37.9 mg/dL — ABNORMAL LOW (ref >39.00)
LDL Cholesterol: 76 mg/dL (ref 0–99)
NonHDL: 88.22
Total CHOL/HDL Ratio: 3
Triglycerides: 63 mg/dL (ref 0.0–149.0)
VLDL: 12.6 mg/dL (ref 0.0–40.0)

## 2023-01-08 LAB — CBC
HCT: 33.1 % — ABNORMAL LOW (ref 36.0–46.0)
Hemoglobin: 10.4 g/dL — ABNORMAL LOW (ref 12.0–15.0)
MCHC: 31.6 g/dL (ref 30.0–36.0)
MCV: 87.4 fl (ref 78.0–100.0)
Platelets: 416 10*3/uL — ABNORMAL HIGH (ref 150.0–400.0)
RBC: 3.78 Mil/uL — ABNORMAL LOW (ref 3.87–5.11)
RDW: 14 % (ref 11.5–15.5)
WBC: 3.6 10*3/uL — ABNORMAL LOW (ref 4.0–10.5)

## 2023-01-08 LAB — TSH: TSH: 2.64 u[IU]/mL (ref 0.35–5.50)

## 2023-01-08 LAB — HEMOGLOBIN A1C: Hgb A1c MFr Bld: 5.3 % (ref 4.6–6.5)

## 2023-01-08 NOTE — Assessment & Plan Note (Signed)
Checking HgA1c and adjust as needed.  

## 2023-01-08 NOTE — Assessment & Plan Note (Signed)
Checking CBC.  °

## 2023-01-08 NOTE — Assessment & Plan Note (Signed)
Checking vitamin d level and adjust as needed.

## 2023-01-08 NOTE — Assessment & Plan Note (Signed)
Flu shot yearly. Tetanus up to date. Pap smear due. Counseled about sun safety and mole surveillance. Counseled about the dangers of distracted driving. Given 10 year screening recommendations.

## 2023-01-08 NOTE — Progress Notes (Signed)
   Subjective:   Patient ID: Traci Finley, female    DOB: Jun 16, 1990, 32 y.o.   MRN: [id number]  HPI The patient is here for physical.  PMH, [facility], social history reviewed and updated  Review of Systems  Constitutional: Negative.   HENT: Negative.    Eyes: Negative.   Respiratory:  Negative for cough, chest tightness and shortness of breath.   Cardiovascular:  Negative for chest pain, palpitations and leg swelling.  Gastrointestinal:  Positive for abdominal pain and diarrhea. Negative for abdominal distention, constipation, nausea and vomiting.  Musculoskeletal: Negative.   Skin: Negative.   Neurological: Negative.   Psychiatric/Behavioral: Negative.      Objective:  Physical Exam Constitutional:      Appearance: She is well-developed.  HENT:     Head: Normocephalic and atraumatic.  Cardiovascular:     Rate and Rhythm: Normal rate and regular rhythm.  Pulmonary:     Effort: Pulmonary effort is normal. No respiratory distress.     Breath sounds: Normal breath sounds. No wheezing or rales.  Abdominal:     General: Bowel sounds are normal. There is no distension.     Palpations: Abdomen is soft.     Tenderness: There is no abdominal tenderness. There is no rebound.  Musculoskeletal:     Cervical back: Normal range of motion.  Skin:    General: Skin is warm and dry.  Neurological:     Mental Status: She is alert and oriented to person, place, and time.     Coordination: Coordination normal.     Vitals:   01/08/23 1327  BP: 114/80  Pulse: 72  Temp: 98.2 F (36.8 C)  TempSrc: Oral  SpO2: 99%  Weight: 213 lb (96.6 kg)  Height: 5\' 5"  (1.651 m)    Assessment & Plan:

## 2023-01-08 NOTE — Assessment & Plan Note (Signed)
Checking CBC and adjust as needed. She is due for visit with gyn and she will schedule.

## 2023-01-09 LAB — T4, FREE: Free T4: 0.94 ng/dL (ref 0.60–1.60)

## 2023-01-09 LAB — VITAMIN D 25 HYDROXY (VIT D DEFICIENCY, FRACTURES): VITD: 20.64 ng/mL — ABNORMAL LOW (ref 30.00–100.00)

## 2023-01-09 LAB — HEPATITIS C ANTIBODY: Hepatitis C Ab: NONREACTIVE

## 2023-01-09 LAB — VITAMIN B12: Vitamin B-12: 502 pg/mL (ref 211–911)

## 2023-03-09 ENCOUNTER — Encounter (HOSPITAL_BASED_OUTPATIENT_CLINIC_OR_DEPARTMENT_OTHER): Payer: Self-pay

## 2023-03-09 ENCOUNTER — Emergency Department (HOSPITAL_BASED_OUTPATIENT_CLINIC_OR_DEPARTMENT_OTHER): Payer: No Typology Code available for payment source | Admitting: Radiology

## 2023-03-09 ENCOUNTER — Emergency Department (HOSPITAL_BASED_OUTPATIENT_CLINIC_OR_DEPARTMENT_OTHER)
Admission: EM | Admit: 2023-03-09 | Discharge: 2023-03-09 | Disposition: A | Discharge status: Home / Self Care | Payer: No Typology Code available for payment source | Attending: Emergency Medicine | Admitting: Emergency Medicine

## 2023-03-09 ENCOUNTER — Other Ambulatory Visit: Payer: Self-pay

## 2023-03-09 DIAGNOSIS — J45909 Unspecified asthma, uncomplicated: Secondary | ICD-10-CM | POA: Diagnosis not present

## 2023-03-09 DIAGNOSIS — Y92410 Unspecified street and highway as the place of occurrence of the external cause: Secondary | ICD-10-CM | POA: Insufficient documentation

## 2023-03-09 DIAGNOSIS — S060X0A Concussion without loss of consciousness, initial encounter: Secondary | ICD-10-CM | POA: Insufficient documentation

## 2023-03-09 DIAGNOSIS — M25572 Pain in left ankle and joints of left foot: Secondary | ICD-10-CM | POA: Diagnosis not present

## 2023-03-09 DIAGNOSIS — R519 Headache, unspecified: Secondary | ICD-10-CM | POA: Diagnosis present

## 2023-03-09 DIAGNOSIS — M25562 Pain in left knee: Secondary | ICD-10-CM | POA: Diagnosis not present
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
## 2023-03-09 NOTE — Discharge Instructions (Addendum)
Was a pleasure caring for you today.  Imaging was reassuring.  Please follow-up with your primary care provider.  Seek emergency care if experiencing any new or worsening symptoms.  Alternating between 650 mg Tylenol and 400 mg Advil: The best way to alternate taking Acetaminophen (example Tylenol) and Ibuprofen (example Advil/Motrin) is to take them 3 hours apart. For example, if you take ibuprofen at 6 am you can then take Tylenol at 9 am. You can continue this regimen throughout the day, making sure you do not exceed the recommended maximum dose for each drug.
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
## 2023-03-13 ENCOUNTER — Ambulatory Visit: Payer: No Typology Code available for payment source | Admitting: Internal Medicine

## 2023-09-21 ENCOUNTER — Ambulatory Visit: Payer: Self-pay

## 2023-09-21 NOTE — Telephone Encounter (Signed)
 Copied from CRM [id number]. Topic: Clinical - Red Word Triage >> Sep 21, 2023  8:06 AM Juluis Ok wrote: [facility] that prompted transfer to Nurse Triage: chest pain and headache   Chief Complaint: chest pain Symptoms: intermittent sharp chest pain Frequency: intermittent Pertinent Negatives: Patient denies cardiac risk factors Disposition: [] ED /[] Urgent Care (no appt availability in office) / [x] Appointment(In office/virtual)/ []  [facility]/ [] Home Care/ [] Refused Recommended Disposition /[]  Mobile Bus/ []  Follow-up with PCP Additional Notes: Patient reports left side chest  pain that comes and goes x 3-4 days. No cardiac history, no lung diseases, pt states that the pain happens more frequently after work. Appt scheduled for 5/12  Reason for Disposition  [1] Chest pain(s) lasting a few seconds AND [2] persists > 3 days  Answer Assessment - Initial Assessment Questions 1. LOCATION: "Where does it hurt?"      Middle to left area, sometimes under the left breast  2. RADIATION: "Does the pain go anywhere else?" (e.g., into neck, jaw, arms, back)     No  3. ONSET: "When did the chest pain begin?" (Minutes, hours or days)      3 days  4. PATTERN: "Does the pain come and go, or has it been constant since it started?"  "Does it get worse with exertion?"      Off /on   5. DURATION: "How long does it last" (e.g., seconds, minutes, hours)     A few seconds  6. SEVERITY: "How bad is the pain?"  (e.g., Scale 1-10; mild, moderate, or severe)    - MILD (1-3): doesn't interfere with normal activities     - MODERATE (4-7): interferes with normal activities or awakens from sleep    - SEVERE (8-10): excruciating pain, unable to do any normal activities       Moderate, the pain "catches" my attention  7. CARDIAC RISK FACTORS: "Do you have any history of heart problems or risk factors for heart disease?" (e.g., angina, prior heart attack; diabetes, high blood pressure, high  cholesterol, smoker, or strong family history of heart disease)     Family history of heart disease  8. PULMONARY RISK FACTORS: "Do you have any history of lung disease?"  (e.g., blood clots in lung, asthma, emphysema, birth control pills)     Child hood asthma,   9. CAUSE: "What do you think is causing the chest pain?"     No  10. OTHER SYMPTOMS: "Do you have any other symptoms?" (e.g., dizziness, nausea, vomiting, sweating, fever, difficulty breathing, cough)       Mild shortness ofbreath and headache  11. PREGNANCY: "Is there any chance you are pregnant?" "When was your last menstrual period?"       LMP this week  Protocols used: Chest Pain-A-AH

## 2023-09-24 ENCOUNTER — Encounter: Payer: Self-pay | Admitting: Family Medicine

## 2023-09-24 ENCOUNTER — Ambulatory Visit: Admitting: Family Medicine

## 2023-09-24 VITALS — BP 122/78 | HR 60 | Temp 97.9°F | Resp 18 | Ht 65.0 in | Wt 212.0 lb

## 2023-09-24 DIAGNOSIS — Z566 Other physical and mental strain related to work: Secondary | ICD-10-CM

## 2023-09-24 DIAGNOSIS — R0602 Shortness of breath: Secondary | ICD-10-CM | POA: Diagnosis not present

## 2023-09-24 DIAGNOSIS — R0789 Other chest pain: Secondary | ICD-10-CM

## 2023-09-24 LAB — COMPREHENSIVE METABOLIC PANEL WITH GFR
ALT: 24 U/L (ref 0–35)
AST: 21 U/L (ref 0–37)
Albumin: 4 g/dL (ref 3.5–5.2)
Alkaline Phosphatase: 67 U/L (ref 39–117)
BUN: 12 mg/dL (ref 6–23)
CO2: 29 meq/L (ref 19–32)
Calcium: 8.9 mg/dL (ref 8.4–10.5)
Chloride: 104 meq/L (ref 96–112)
Creatinine, Ser: 0.67 mg/dL (ref 0.40–1.20)
GFR: 113.93 mL/min (ref >60.00)
Glucose, Bld: 92 mg/dL (ref 70–99)
Potassium: 3.8 meq/L (ref 3.5–5.1)
Sodium: 139 meq/L (ref 135–145)
Total Bilirubin: 0.4 mg/dL (ref 0.2–1.2)
Total Protein: 7 g/dL (ref 6.0–8.3)

## 2023-09-24 LAB — POC COVID19 BINAXNOW: SARS Coronavirus 2 Ag: NEGATIVE

## 2023-09-24 LAB — CBC WITH DIFFERENTIAL/PLATELET
Basophils Absolute: 0 10*3/uL (ref 0.0–0.1)
Basophils Relative: 0.3 % (ref 0.0–3.0)
Eosinophils Absolute: 0.1 10*3/uL (ref 0.0–0.7)
Eosinophils Relative: 1.7 % (ref 0.0–5.0)
HCT: 32.1 % — ABNORMAL LOW (ref 36.0–46.0)
Hemoglobin: 10.5 g/dL — ABNORMAL LOW (ref 12.0–15.0)
Lymphocytes Relative: 43.7 % (ref 12.0–46.0)
Lymphs Abs: 2 10*3/uL (ref 0.7–4.0)
MCHC: 32.8 g/dL (ref 30.0–36.0)
MCV: 88.8 fl (ref 78.0–100.0)
Monocytes Absolute: 0.5 10*3/uL (ref 0.1–1.0)
Monocytes Relative: 10.5 % (ref 3.0–12.0)
Neutro Abs: 2 10*3/uL (ref 1.4–7.7)
Neutrophils Relative %: 43.8 % (ref 43.0–77.0)
Platelets: 381 10*3/uL (ref 150.0–400.0)
RBC: 3.62 Mil/uL — ABNORMAL LOW (ref 3.87–5.11)
RDW: 14.8 % (ref 11.5–15.5)
WBC: 4.5 10*3/uL (ref 4.0–10.5)

## 2023-09-24 LAB — TSH: TSH: 3.89 u[IU]/mL (ref 0.35–5.50)

## 2023-09-24 NOTE — Progress Notes (Unsigned)
 Assessment & Plan:  1. Work-related stress (Primary) Encouraged patient to keep her upcoming appointment to start counseling.  Discussed the option of medication. She wants to try counseling first. I agree if working from home is an option, it would be in her best interest until she gets anxiety/stress under control.   2. Other chest pain Reassurance provided that symptoms are not related to her heart. Labs to rule out other causes.  - EKG 12-Lead - Comprehensive metabolic panel with GFR - CBC with Differential/Platelet - TSH  3. Shortness of breath Likely related to anxiety/stress. Labs to rule out other causes.  - POC COVID-19 BinaxNow - Comprehensive metabolic panel with GFR - CBC with Differential/Platelet - TSH   Follow up plan: Return in about 4 weeks (around 10/22/2023) for anxiety/stress with PCP.  Hershel Los, MSN, APRN, FNP-C  Subjective:  HPI: Traci Finley is a 33 y.o. female presenting on 09/24/2023 for Chest Pain (Chest pain, SOB, HA - Started last Thursday - had nurse (at her work) check BP, asked some questions - she said just follow up as needed; symptoms subsided on FRIDAY (chest pain), Sunday (HA), SOB is still kind of off/on. Recent anxiety/ stress with job. )  Patient reports last Thursday began the onset of chest pain, shortness of breath, and headache while at work.  The chest pain initially felt sharp for 1 to 2 seconds and then turned into an aching pain that lasted about an hour.  She does report feeling a slight fluttering sensation in her chest immediately after the sharp pain; that is the only time she has felt that.  She denies tachycardia, cough, upper respiratory symptoms, bleeding, dizziness, radiation of the chest pain, and sweating.  The headache persisted until yesterday.  She described the headache as tension in the front of her head.  She has had COVID-19 twice and both times headache was her only symptom, but she states that headache felt very  different from what she has been experiencing recently.  She does have a history of allergies, anemia, and asthma but states she has not had any issues with these.  She saw the nurse at work who did check her blood pressure and said it was fine.  She feels her symptoms are related to stress at work that is affecting her both in and outside of work.  She works in [employer] for [employer] and was made aware that due to ADA she would be eligible to work from home if her provider agrees this would be beneficial.  She states she worked from home for the same company from 2022-2024 as she did not have any issues with stress.  She did go ahead and schedule herself an appointment to start counseling as she does not like the idea of starting on a medication.  She does not like medications in general and has a family history of drug abuse and therefore does not want to be put on any medications that she could potentially become dependent on.  She did add if it is helpful at all she feels like she has been smelling smoke where smoke is not present for the past two days.     09/24/2023    1:05 PM 08/31/2022    1:17 PM 02/26/2020   11:02 AM  Depression screen PHQ 2/9  Decreased Interest 1 0 0  Down, Depressed, Hopeless 1 0 0  PHQ - 2 Score 2 0 0  Altered sleeping 1  Tired, decreased energy 1    Feeling bad or failure about yourself  0    Trouble concentrating 0    Moving slowly or fidgety/restless 0    Suicidal thoughts 0    PHQ-9 Score 4    Difficult doing work/chores Somewhat difficult        09/24/2023    1:05 PM 10/08/2020    8:20 AM  GAD 7 : Generalized Anxiety Score  Nervous, Anxious, on Edge 1 1  Control/stop worrying 0 0  Worry too much - different things 0 0  Trouble relaxing 1 0  Restless 0 0  Easily annoyed or irritable 1 1  Afraid - awful might happen 0 0  Total GAD 7 Score 3 2  Anxiety Difficulty Somewhat difficult      ROS: Negative unless specifically  indicated above in HPI.   Relevant past medical history reviewed and updated as indicated.   Allergies and medications reviewed and updated.   Current Outpatient Medications:    cholecalciferol (VITAMIN D3) 25 MCG (1000 UNIT) tablet, Take 1,000 Units by mouth daily., Disp: , Rfl:   No Known Allergies  Objective:   BP 122/78   Pulse 60   Temp 97.9 F (36.6 C)   Resp 18   Ht 5\' 5"  (1.651 m)   Wt 212 lb (96.2 kg)   LMP 09/18/2023 (Exact Date)   SpO2 99%   BMI 35.28 kg/m    Physical Exam Vitals reviewed.  Constitutional:      General: She is not in acute distress.    Appearance: Normal appearance. She is not ill-appearing, toxic-appearing or diaphoretic.  HENT:     Head: Normocephalic and atraumatic.  Eyes:     General: No scleral icterus.       Right eye: No discharge.        Left eye: No discharge.     Conjunctiva/sclera: Conjunctivae normal.  Cardiovascular:     Rate and Rhythm: Normal rate and regular rhythm.     Heart sounds: Normal heart sounds. No murmur heard.    No friction rub. No gallop.  Pulmonary:     Effort: Pulmonary effort is normal. No respiratory distress.     Breath sounds: Normal breath sounds. No stridor. No wheezing, rhonchi or rales.  Musculoskeletal:        General: Normal range of motion.     Cervical back: Normal range of motion.  Skin:    General: Skin is warm and dry.     Capillary Refill: Capillary refill takes less than 2 seconds.  Neurological:     General: No focal deficit present.     Mental Status: She is alert and oriented to person, place, and time. Mental status is at baseline.  Psychiatric:        Mood and Affect: Mood normal.        Behavior: Behavior normal.        Thought Content: Thought content normal.        Judgment: Judgment normal.     EKG: normal EKG, normal sinus rhythm.HR 60

## 2023-09-25 ENCOUNTER — Ambulatory Visit: Payer: Self-pay | Admitting: Family Medicine

## 2023-11-07 IMAGING — DX DG CHEST 2V
2 series · 2 of 2 positions shown · non-contrast
Comparison: 12/09/2018

CLINICAL DATA: Chest pain for the past 2 days.

EXAM:
CHEST - 2 VIEW

[chest pa]
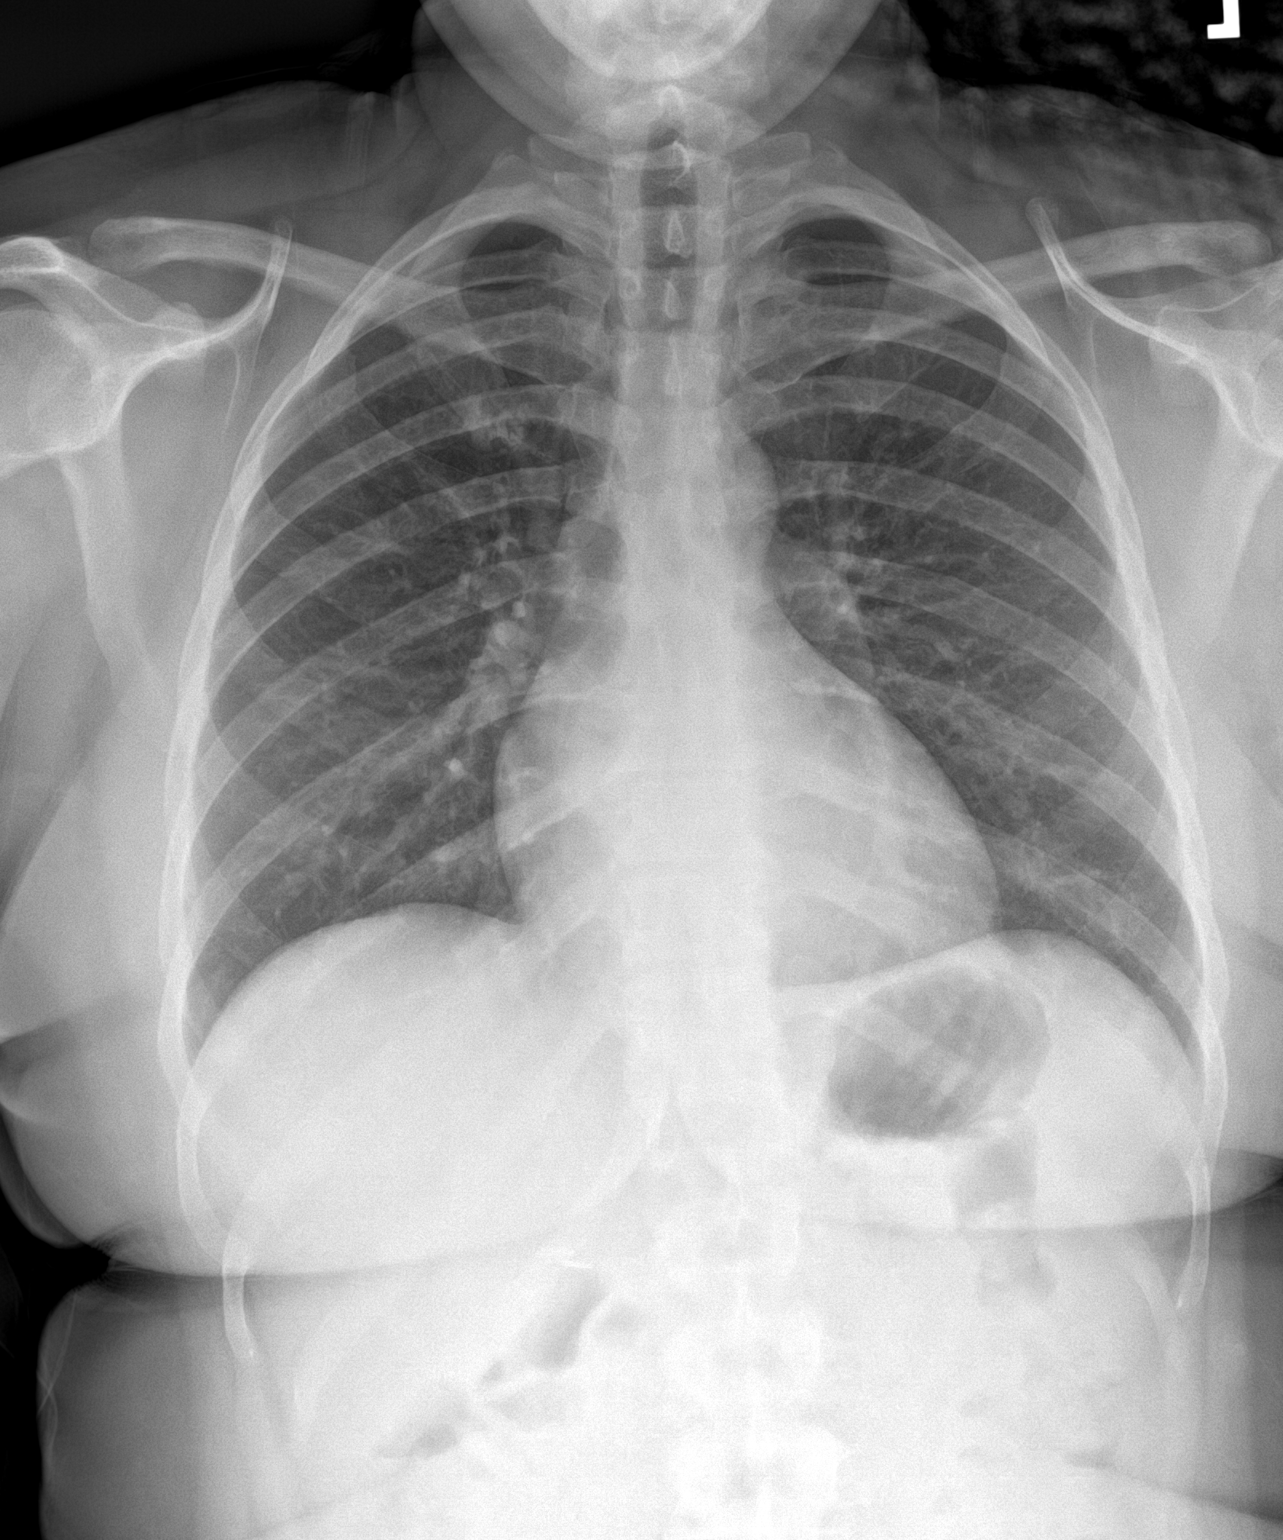

[chest lat]
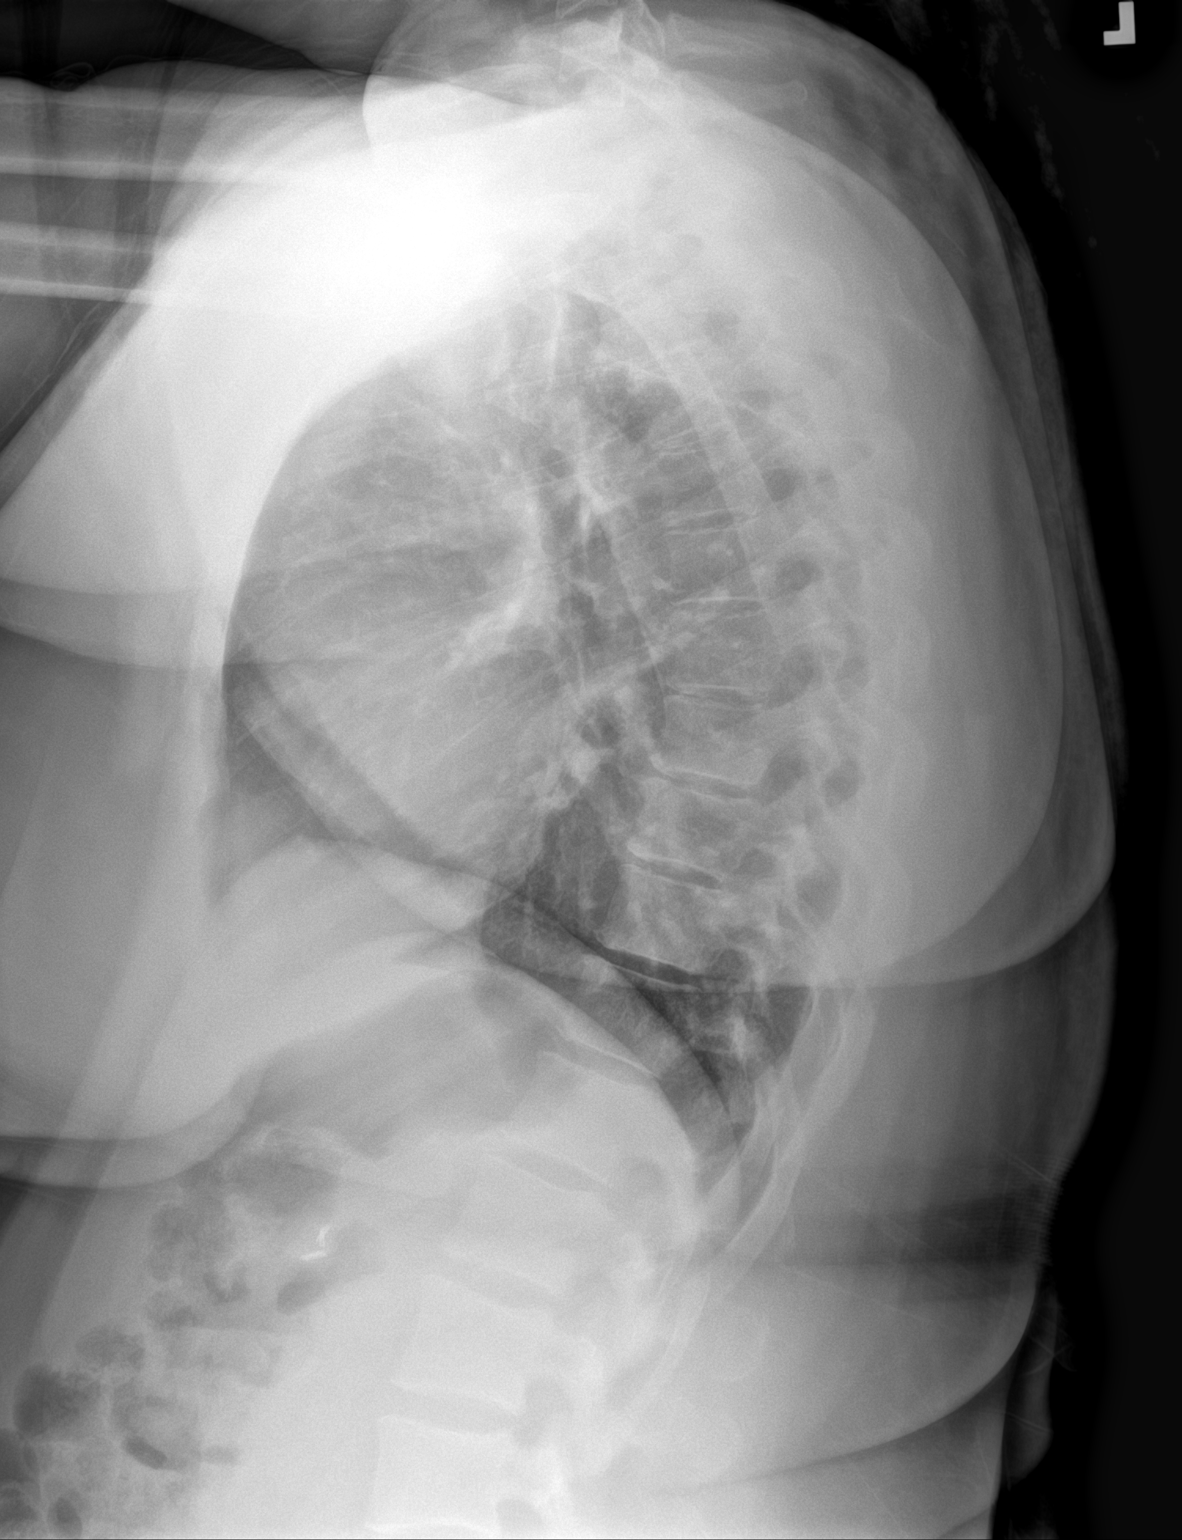

[2 of 2 positions shown; findings below may reference images not displayed]

FINDINGS: Normal sized heart. Clear lungs with normal vascularity.
Unremarkable bones. Cholecystectomy clips.
IMPRESSION: No acute abnormality.
